# Patient Record
Sex: Male | Born: 1995 | Race: White | Hispanic: No | Marital: Single | Smoking: Never smoker
Health system: Southern US, Community
[De-identification: ages and names within clinical notes are randomized; demographics above are authoritative.]

## PROBLEM LIST (undated history)

## (undated) DIAGNOSIS — R519 Headache, unspecified: Secondary | ICD-10-CM

## (undated) DIAGNOSIS — K219 Gastro-esophageal reflux disease without esophagitis: Secondary | ICD-10-CM

## (undated) DIAGNOSIS — R51 Headache: Secondary | ICD-10-CM

## (undated) DIAGNOSIS — G8929 Other chronic pain: Secondary | ICD-10-CM

## (undated) DIAGNOSIS — G039 Meningitis, unspecified: Secondary | ICD-10-CM

## (undated) DIAGNOSIS — G919 Hydrocephalus, unspecified: Secondary | ICD-10-CM

## (undated) HISTORY — PX: BRAIN SURGERY: SHX531

## (undated) HISTORY — DX: Other chronic pain: G89.29

## (undated) HISTORY — DX: Gastro-esophageal reflux disease without esophagitis: K21.9

## (undated) HISTORY — DX: Hydrocephalus, unspecified: G91.9

## (undated) HISTORY — DX: Headache: R51

## (undated) HISTORY — PX: DENTAL SURGERY: SHX609

## (undated) HISTORY — DX: Headache, unspecified: R51.9

## (undated) HISTORY — PX: OTHER SURGICAL HISTORY: SHX169

## (undated) HISTORY — PX: LUMBAR PUNCTURE: SHX1985

## (undated) HISTORY — DX: Meningitis, unspecified: G03.9

## (undated) HISTORY — PX: UMBILICAL HERNIA REPAIR: SHX196

---
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
## 2015-04-29 IMAGING — US US SCROTUM
1 series · 14 of 25 positions shown · non-contrast
Comparison: None.

CLINICAL DATA: 17-year-old male with left scrotal fullness.

EXAM:
ULTRASOUND OF SCROTUM
TECHNIQUE: Complete ultrasound examination of the testicles, epididymis, and
other scrotal structures was performed.

[Series 1: us scrotum · 0.08mm/px · 14 of 40 slices shown]
[im 1/40]
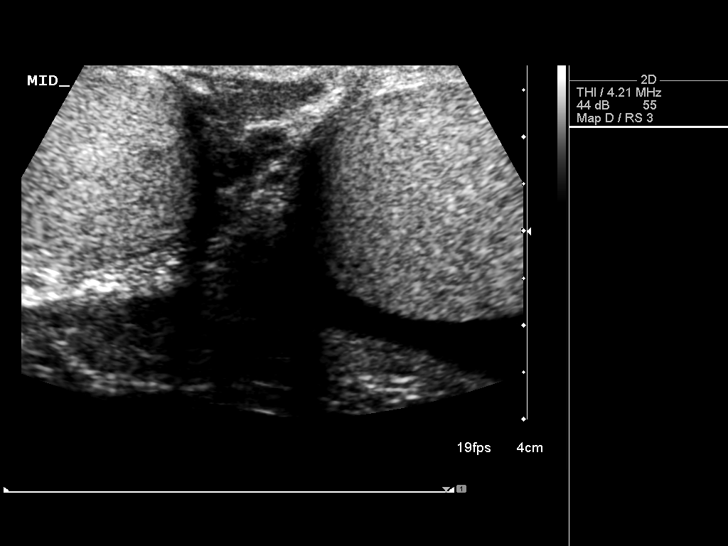
[im 4/40]
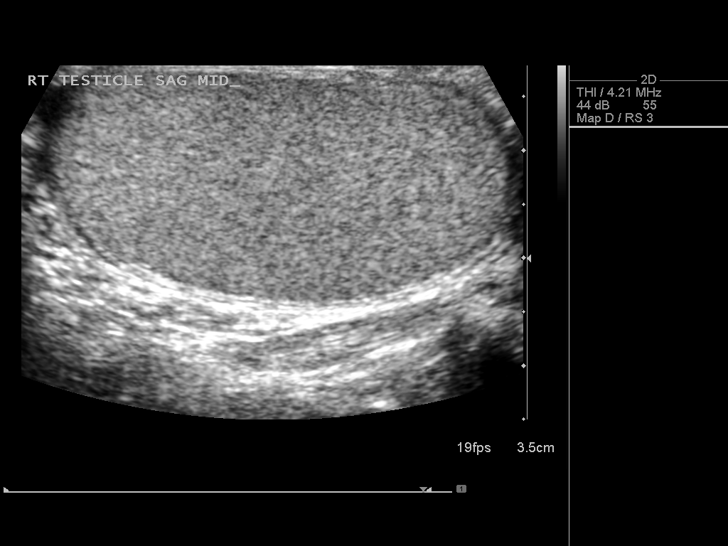
[im 7/40]
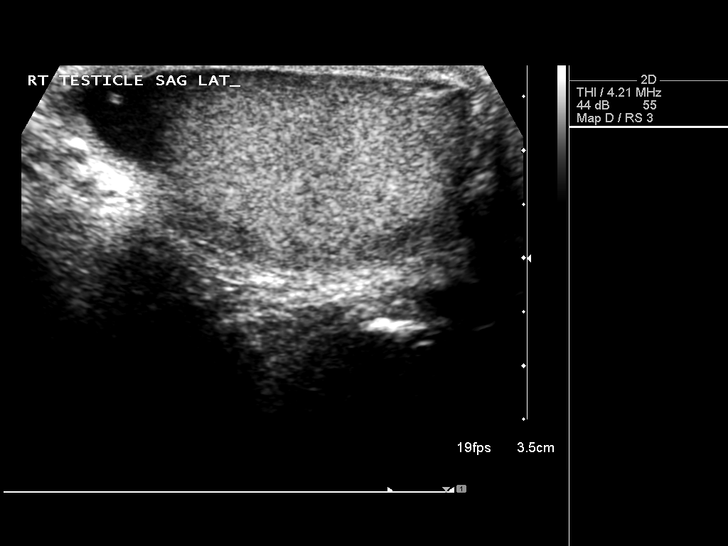
[im 10/40]
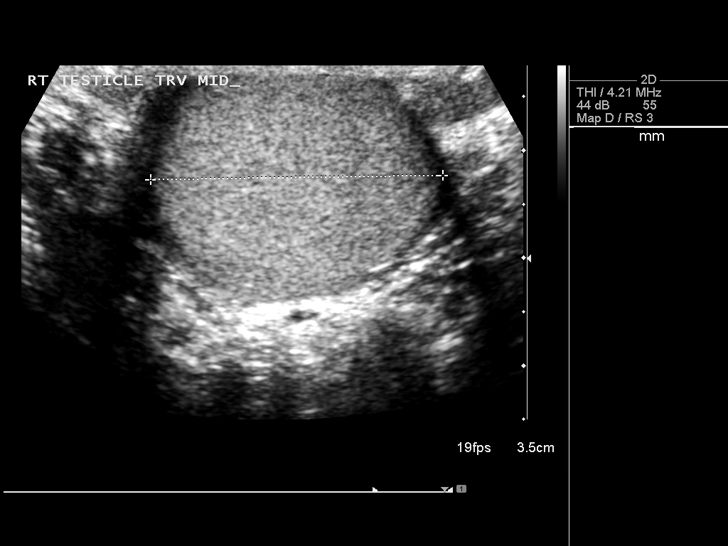
[im 14/40]
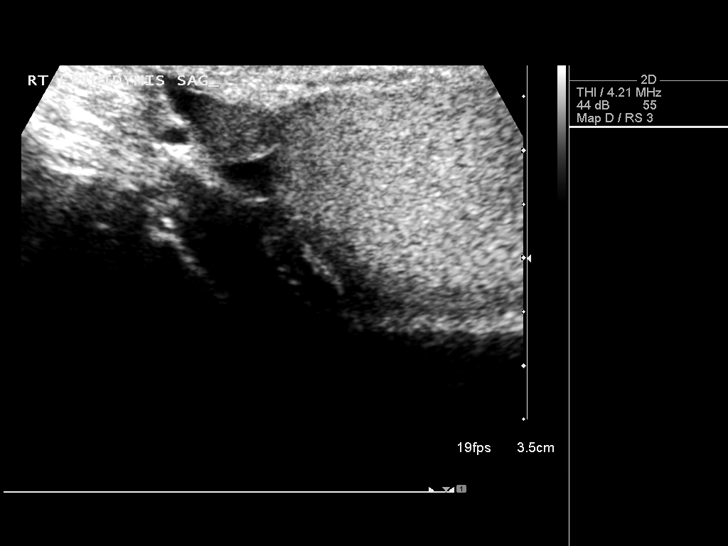
[im 15/40]
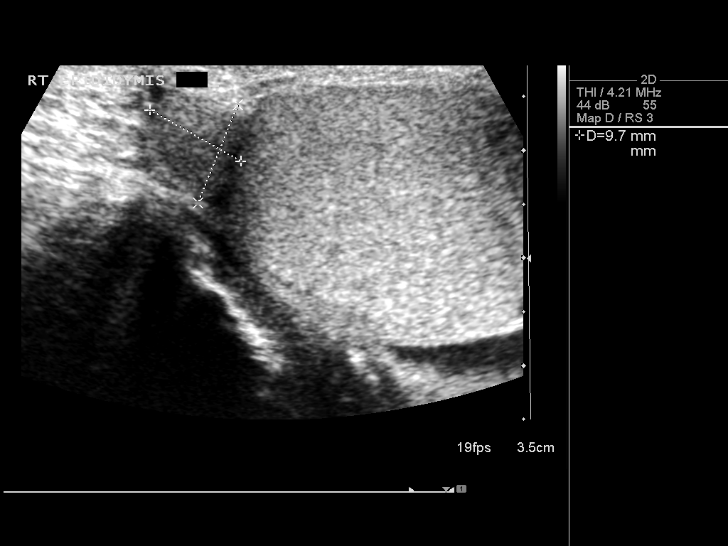
[im 18/40]
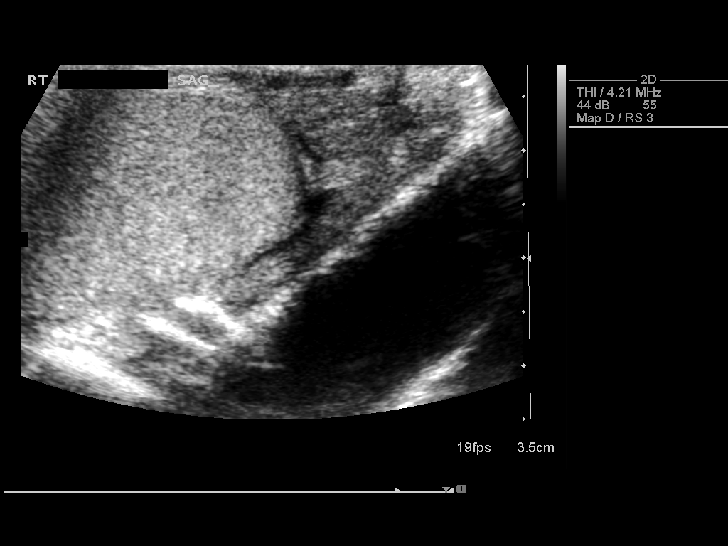
[im 22/40]
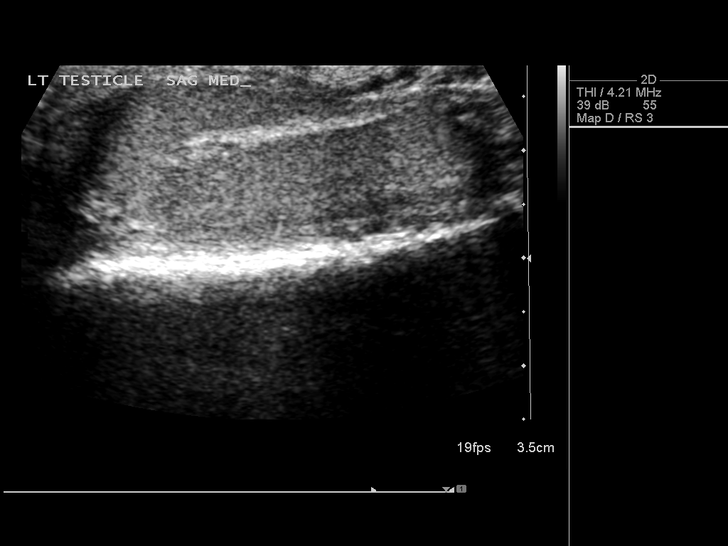
[im 25/40]
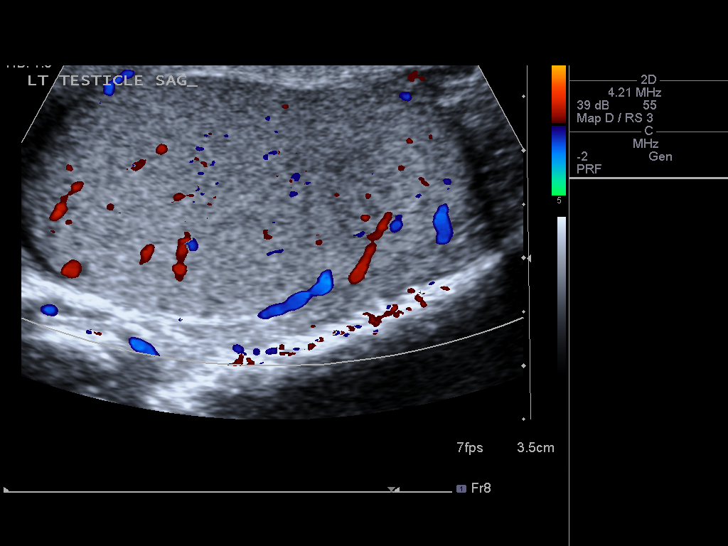
[im 27/40]
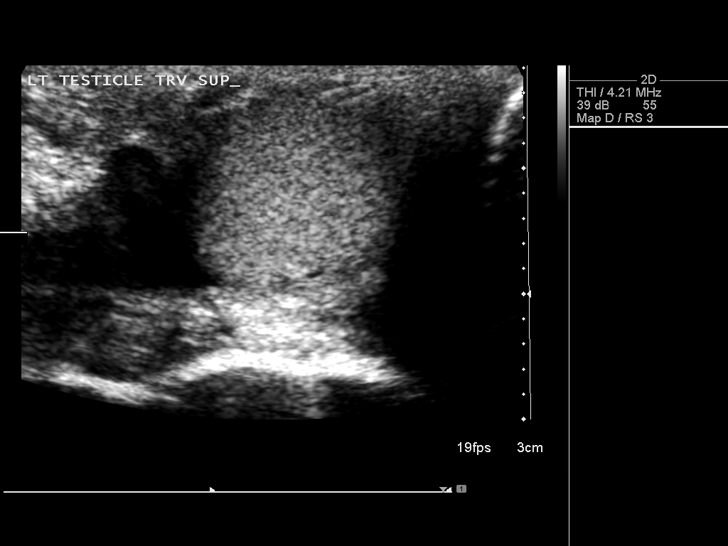
[im 30/40]
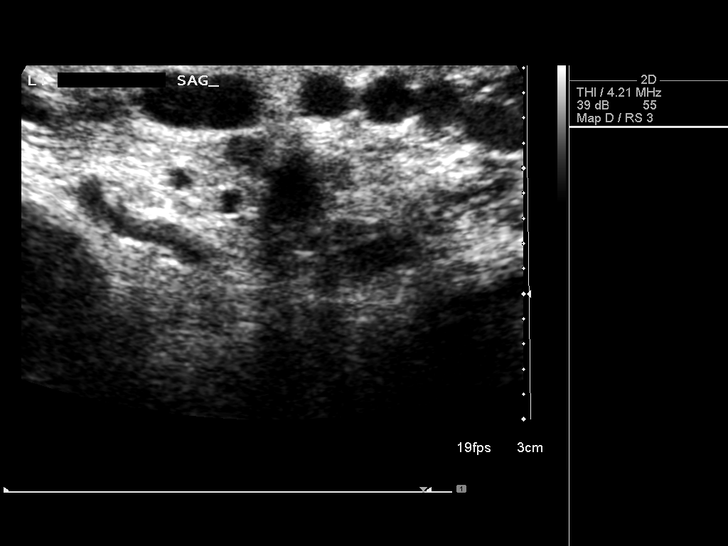
[im 33/40]
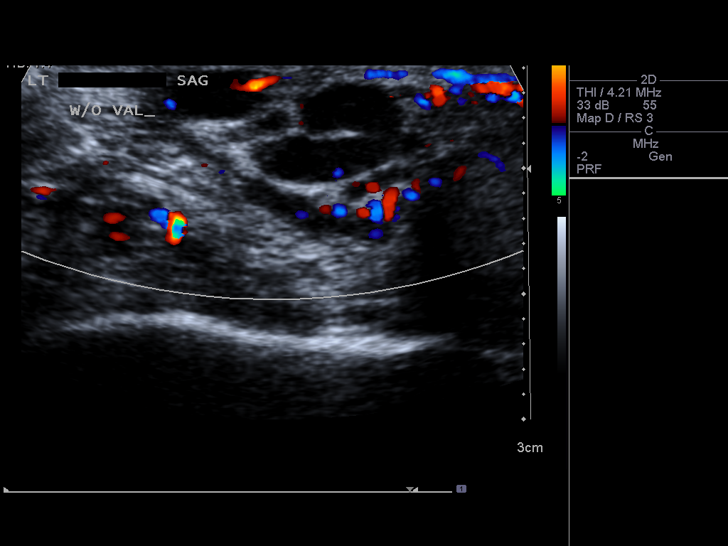
[im 36/40]
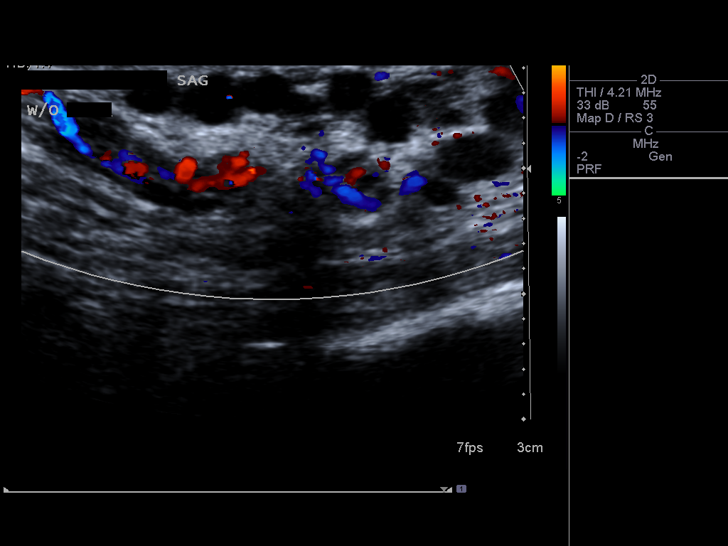
[im 40/40]
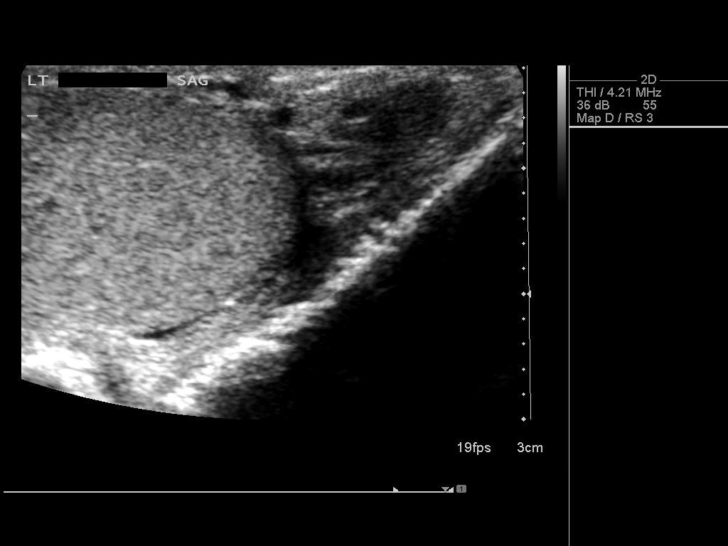

[14 of 25 positions shown; findings below may reference images not displayed]

FINDINGS: Right testicle

Measurements: 4.4 x 2.1 x 2.7 cm No mass or microlithiasis
visualized.

Left testicle

Measurements: 4.5 x 2.1 x 2.8 cm No mass or microlithiasis
visualized.

Right epididymis:  Normal in size and appearance.

Left epididymis:  Normal in size and appearance.

Hydrocele:  None visualized.

Varicocele:  A moderate left varicocele is noted.
IMPRESSION: Left varicocele.

Normal testicles bilaterally.  .

## 2017-12-28 ENCOUNTER — Ambulatory Visit (INDEPENDENT_AMBULATORY_CARE_PROVIDER_SITE_OTHER): Payer: 59 | Admitting: Pulmonary Disease

## 2017-12-28 ENCOUNTER — Encounter: Payer: Self-pay | Admitting: Pulmonary Disease

## 2017-12-28 VITALS — BP 128/80 | HR 73 | Ht 72.0 in | Wt 189.2 lb

## 2017-12-28 DIAGNOSIS — R059 Cough, unspecified: Secondary | ICD-10-CM

## 2017-12-28 DIAGNOSIS — R05 Cough: Secondary | ICD-10-CM | POA: Diagnosis not present

## 2017-12-28 LAB — NITRIC OXIDE: Nitric Oxide: 15

## 2017-12-28 NOTE — Progress Notes (Signed)
Daniel Smith    [id number]    [birth year]  Primary Care Physician:Lowe, Efraim Kaufmann, MD  Referring Physician: Loyola Mast, MD [address] [place], [place] [address]  Chief complaint: Evaluation for cough  HPI: 22 year old with no significant past medical history He had an episode of cough after sinusitis, bronchitis in late November.  He was given Z-Pak with resolution of symptoms.  He had recurrence of cough with chest pain in January.  Chest x-ray and EKG at that time were normal.  He was given Prilosec which improved symptoms.  He has remained symptom-free until today when he developed some sinus congestion, postnasal drip, allergic rhinitis and nonproductive cough.  Denies any dyspnea, wheezing, fevers, chills.  Pets: 2 dogs, no cats, birds, farm animals Occupation: Works as a Water quality scientist.  Wants to start grad school for an [employer] Exposures: No known exposures, no mold, hot tub, Smoking history: Never smoker Travel History: Not significant  Outpatient Encounter Medications as of 12/28/2017  Medication Sig  . acetaminophen (TYLENOL) 325 MG tablet Take 650 mg by mouth every 6 (six) hours as needed.  . [DISCONTINUED] HYDROcodone-acetaminophen (NORCO) 5-325 MG per tablet Take 1 tablet by mouth every 4 (four) hours as needed for pain.   No facility-administered encounter medications on file as of 12/28/2017.     Allergies as of 12/28/2017  . (No Known Allergies)    No past medical history on file.  Past Surgical History:  Procedure Laterality Date  . ear tubes      Family History  Problem Relation Age of Onset  . COPD Maternal Grandmother   . Cancer Maternal Grandfather   . COPD Maternal Grandfather   . Hypertension Paternal Grandmother     Social History   Socioeconomic History  . Marital status: Single    Spouse name: Not on file  . Number of children: Not on file  . Years of education: Not on file  . Highest education level: Not on file  Occupational  History  . Not on file  Social Needs  . Financial resource strain: Not on file  . Food insecurity:    Worry: Not on file    Inability: Not on file  . Transportation needs:    Medical: Not on file    Non-medical: Not on file  Tobacco Use  . Smoking status: Never Smoker  . Smokeless tobacco: Never Used  Substance and Sexual Activity  . Alcohol use: No  . Drug use: No  . Sexual activity: Not on file  Lifestyle  . Physical activity:    Days per week: Not on file    Minutes per session: Not on file  . Stress: Not on file  Relationships  . Social connections:    Talks on phone: Not on file    Gets together: Not on file    Attends religious service: Not on file    Active member of club or organization: Not on file    Attends meetings of clubs or organizations: Not on file    Relationship status: Not on file  . Intimate partner violence:    Fear of current or ex partner: Not on file    Emotionally abused: Not on file    Physically abused: Not on file    Forced sexual activity: Not on file  Other Topics Concern  . Not on file  Social History Narrative  . Not on file    Review of systems: Review  of Systems  Constitutional: Negative for fever and chills.  HENT: Negative.   Eyes: Negative for blurred vision.  Respiratory: as per HPI  Cardiovascular: Negative for chest pain and palpitations.  Gastrointestinal: Negative for vomiting, diarrhea, blood per rectum. Genitourinary: Negative for dysuria, urgency, frequency and hematuria.  Musculoskeletal: Negative for myalgias, back pain and joint pain.  Skin: Negative for itching and rash.  Neurological: Negative for dizziness, tremors, focal weakness, seizures and loss of consciousness.  Endo/Heme/Allergies: Negative for environmental allergies.  Psychiatric/Behavioral: Negative for depression, suicidal ideas and hallucinations.  All other systems reviewed and are negative.  Physical Exam: Blood pressure 128/80, pulse 73,  height 6' (1.829 m), weight 189 lb 3.2 oz (85.8 kg), SpO2 98 %. Gen:      No acute distress HEENT:  EOMI, sclera anicteric Neck:     No masses; no thyromegaly Lungs:    Clear to auscultation bilaterally; normal respiratory effort CV:         Regular rate and rhythm; no murmurs Abd:      + bowel sounds; soft, non-tender; no palpable masses, no distension Ext:    No edema; adequate peripheral perfusion Skin:      Warm and dry; no rash Neuro: alert and oriented x 3 Psych: normal mood and affect  Data Reviewed: FENO 12/28/17-15  Assessment:  Assessment for cough Suspicion for asthma is low. He likely has upper airway cough from postnasal drip, allergies Chest x-ray at primary care office was normal as per patient.  We will try to obtain these results for our records. I have advised him to use over-the-counter antihistamine and steroid nasal spray  We discussed getting allergy profile and PFTs but have decided to defer this testing Reassess if his symptoms are recurrent  Plan/Recommendations: - Over-the-counter antihistamine, Flonase nasal spray - Obtain results of chest x-ray from primary care  Return to clinic as needed  Chilton GreathousePraveen Lc Joynt MD [facility] 12/28/2017, 11:02 AM  CC: Loyola MastLowe, Melissa, MD

## 2017-12-28 NOTE — Patient Instructions (Signed)
It does not appear you have asthma or any other lung issues I suspect that your cough is from allergies, postnasal drip You can try over-the-counter Zyrtec or Claritin and Flonase nasal spray Please give us a call if your symptoms are persistent and we can check some blood test and lung function test to evaluate you for asthma Otherwise you can return to the pulmonary clinic as needed.

## 2018-05-06 ENCOUNTER — Telehealth: Payer: Self-pay | Admitting: Medical

## 2018-05-06 NOTE — Telephone Encounter (Signed)
Received medical records from Dr. Loyola MastMelissa Lowe. Sending back for review.

## 2018-05-28 ENCOUNTER — Encounter: Payer: Self-pay | Admitting: Medical

## 2018-05-28 ENCOUNTER — Ambulatory Visit: Payer: 59 | Admitting: Medical

## 2018-05-28 VITALS — BP 112/74 | HR 67 | Temp 97.7°F | Resp 16 | Ht 70.5 in | Wt 183.2 lb

## 2018-05-28 DIAGNOSIS — Z0000 Encounter for general adult medical examination without abnormal findings: Secondary | ICD-10-CM

## 2018-05-28 DIAGNOSIS — Z23 Encounter for immunization: Secondary | ICD-10-CM | POA: Diagnosis not present

## 2018-05-28 LAB — POCT URINALYSIS DIP (PROADVANTAGE DEVICE)
Bilirubin, UA: NEGATIVE
Blood, UA: NEGATIVE
Glucose, UA: NEGATIVE mg/dL
Ketones, POC UA: NEGATIVE mg/dL
Leukocytes, UA: NEGATIVE
Nitrite, UA: NEGATIVE
Protein Ur, POC: NEGATIVE mg/dL
Specific Gravity, Urine: 1.01
Urobilinogen, Ur: NEGATIVE
pH, UA: 7 (ref 5.0–8.0)

## 2018-05-28 NOTE — Progress Notes (Signed)
Subjective: Chief Complaint  Patient presents with  . NP    NP cpe non fasting vision 2019   Here as a new patient.   Was seeing [facility] prior.    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  Has some interest in real estate as well.  Medical care team includes: Abbie Jablon, Kermit Balo, PA-C here for primary care Dentist Eye doctor  Concerns: none  Reviewed their medical, surgical, family, social, medication, and allergy history and updated chart as appropriate.  Past Medical History:  Diagnosis Date  . Chronic headaches    on average every 2 months  . GERD (gastroesophageal reflux disease)   . Hydrocephalus 2007   initial procedure failed, 2nd procedure helped clear this as of 2017.  Dr. Marice Potter, [facility]  . Meningitis    as an infant    Past Surgical History:  Procedure Laterality Date  . BRAIN SURGERY     twice for hydrocephalus; first attempt failed to resolve   . ear tubes    . LUMBAR PUNCTURE     infant  . UMBILICAL HERNIA REPAIR      Social History   Socioeconomic History  . Marital status: Single    Spouse name: Not on file  . Number of children: Not on file  . Years of education: Not on file  . Highest education level: Not on file  Occupational History  . Not on file  Social Needs  . Financial resource strain: Not on file  . Food insecurity:    Worry: Not on file    Inability: Not on file  . Transportation needs:    Medical: Not on file    Non-medical: Not on file  Tobacco Use  . Smoking status: Never Smoker  . Smokeless tobacco: Never Used  Substance and Sexual Activity  . Alcohol use: No  . Drug use: No  . Sexual activity: Not on file  Lifestyle  . Physical activity:    Days per week: Not on file    Minutes per session: Not on file  . Stress: Not on file  Relationships  . Social connections:    Talks on phone: Not on file    Gets together: Not on  file    Attends religious service: Not on file    Active member of club or organization: Not on file    Attends meetings of clubs or organizations: Not on file    Relationship status: Not on file  . Intimate partner violence:    Fear of current or ex partner: Not on file    Emotionally abused: Not on file    Physically abused: Not on file    Forced sexual activity: Not on file  Other Topics Concern  . 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  Exercise - weights, some cardio.   Soccer coach.   Eats healthy.   No significant other.   05/2018    Family History  Problem Relation Age of Onset  . COPD Maternal Grandmother   . Cancer Maternal Grandfather        non hodgkins   . COPD Maternal Grandfather   . Hypertension Paternal Grandmother   . Irritable bowel syndrome Brother   . Cancer Paternal Grandfather        pancreatic    No current outpatient medications on file.  No Known Allergies  Review of Systems Constitutional: -fever, -chills, -sweats, -unexpected weight change, -decreased appetite, -fatigue Allergy: -sneezing, -itching, -congestion Dermatology: -changing moles, --rash, -lumps ENT: -runny nose, -ear pain, -sore throat, -hoarseness, -sinus pain, -teeth pain, - ringing in ears, -hearing loss, -nosebleeds Cardiology: -chest pain, -palpitations, -swelling, -difficulty breathing when lying flat, -waking up short of breath Respiratory: -cough, -shortness of breath, -difficulty breathing with exercise or exertion, -wheezing, -coughing up blood Gastroenterology: -abdominal pain, -nausea, -vomiting, -diarrhea, -constipation, -blood in stool, -changes in bowel movement, -difficulty swallowing or eating Hematology: -bleeding, -bruising  Musculoskeletal: -joint aches, -muscle aches, -joint swelling, -back pain, -neck pain, -cramping, -changes in gait Ophthalmology: denies vision changes, eye redness, itching, discharge Urology: -burning with  urination, -difficulty urinating, -blood in urine, -urinary frequency, -urgency, -incontinence Neurology: -headache, -weakness, -tingling, -numbness, -memory loss, -falls, -dizziness Psychology: -depressed mood, -agitation, -sleep problems Male GU: no testicular mass, pain, no lymph nodes swollen, no swelling, no rash.     Objective:  BP 112/74   Pulse 67   Temp 97.7 F (36.5 C) (Oral)   Resp 16   Ht 5' 10.5" (1.791 m)   Wt 183 lb 3.2 oz (83.1 kg)   SpO2 99%   BMI 25.91 kg/m   General appearance: alert, no distress, WD/WN, Caucasian male Skin: unremarkable, scattered macules, no worrisome lesions HEENT: normocephalic, conjunctiva/corneas normal, sclerae anicteric, PERRLA, EOMi, nares patent, no discharge or erythema, pharynx normal Oral cavity: MMM, tongue normal, teeth normal Neck: supple, no lymphadenopathy, no thyromegaly, no masses, normal ROM, no bruits Chest: non tender, normal shape and expansion Heart: RRR, normal S1, S2, no murmurs Lungs: CTA bilaterally, no wheezes, rhonchi, or rales Abdomen: +bs, soft, non tender, non distended, no masses, no hepatomegaly, no splenomegaly, no bruits Back: non tender, normal ROM, no scoliosis Musculoskeletal: upper extremities non tender, no obvious deformity, normal ROM throughout, lower extremities non tender, no obvious deformity, normal ROM throughout Extremities: no edema, no cyanosis, no clubbing Pulses: 2+ symmetric, upper and lower extremities, normal cap refill Neurological: alert, oriented x 3, CN2-12 intact, strength normal upper extremities and lower extremities, sensation normal throughout, DTRs 2+ throughout, no cerebellar signs, gait normal Psychiatric: normal affect, behavior normal, pleasant  GU: normal male external genitalia,circumcised, nontender, no masses, no hernia, no lymphadenopathy Rectal: deferred   Assessment and Plan :   Encounter Diagnoses  Name Primary?  . Routine general medical examination at a  health care facility Yes  . Need for influenza vaccination     Physical exam - discussed and counseled on healthy lifestyle, diet, exercise, preventative care, vaccinations, sick and well care, proper use of emergency dept and after hours care, and addressed their concerns.    Health screening: See your eye doctor yearly for routine vision care. See your dentist yearly for routine dental care including hygiene visits twice yearly.  Discussed STD testing, discussed prevention, condom use, means of transmission.  He reports STD screen earlier this year.  No new sexual partners since.    Cancer screening Advised monthly self testicular exam  Vaccinations: Advised yearly influenza vaccine Counseled on the influenza virus vaccine.  Vaccine information sheet given.  Influenza vaccine given after consent obtained.  Reviewed [place] vaccine registry.  He is up to date on vaccines including HPV series.  He declines routine screening labs  Tahjir was seen today for np.  Diagnoses and all orders for this visit:  Routine general medical examination at a health care facility -     POCT Urinalysis DIP (Proadvantage Device)  Need  for influenza vaccination -     Flu Vaccine QUAD 6+ mos PF IM (Fluarix Quad PF)   Follow-up pending labs, yearly for physical

## 2018-11-18 ENCOUNTER — Encounter: Payer: Self-pay | Admitting: Medical

## 2018-11-18 ENCOUNTER — Ambulatory Visit: Payer: 59 | Admitting: Medical

## 2018-11-18 VITALS — BP 120/76 | HR 72 | Temp 98.0°F | Resp 16 | Ht 72.0 in | Wt 174.6 lb

## 2018-11-18 DIAGNOSIS — F419 Anxiety disorder, unspecified: Secondary | ICD-10-CM | POA: Diagnosis not present

## 2018-11-18 DIAGNOSIS — L853 Xerosis cutis: Secondary | ICD-10-CM

## 2018-11-18 DIAGNOSIS — F428 Other obsessive-compulsive disorder: Secondary | ICD-10-CM

## 2018-11-18 DIAGNOSIS — B001 Herpesviral vesicular dermatitis: Secondary | ICD-10-CM | POA: Insufficient documentation

## 2018-11-18 MED ORDER — HYDROCORTISONE VALERATE 0.2 % EX OINT: 1.0000 "application " | TOPICAL_OINTMENT | Freq: Two times a day (BID) | CUTANEOUS | 0 refills | Status: AC

## 2018-11-18 MED ORDER — VALACYCLOVIR HCL 1 G PO TABS: ORAL_TABLET | ORAL | 0 refills | Status: DC
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
## 2018-11-18 NOTE — Patient Instructions (Addendum)
Cold Sore  A cold sore, also called a fever blister, is a small, fluid-filled sore that forms inside of the mouth or on the lips, gums, nose, chin, or cheeks. Cold sores can spread to other parts of the body, such as the eyes or fingers. Cold sores can spread from person to person (are contagious) until the sores crust over completely. Most cold sores go away within 2 weeks. What are the causes? Cold sores are caused by a virus (herpes simplex virus type 1, HSV-1). The virus can spread from person to person through close contact, such as through:  Kissing.  Touching the affected area.  Sharing personal items such as lip balm, razors, a drinking glass, or eating utensils. What increases the risk? You are more likely to develop this condition if you:  Are tired, stressed, or sick.  Are having your period (menstruating).  Are pregnant.  Take certain medicines.  Are out in cold weather or get too much sun. What are the signs or symptoms? Symptoms of a cold sore outbreak go through different stages. These are the stages of a cold sore:  Tingling, itching, or burning is felt 1-2 days before the outbreak.  Fluid-filled blisters appear on the lips, inside the mouth, on the nose, or on the cheeks.  The blisters start to ooze clear fluid.  The blisters dry up, and a yellow crust appears in their place.  The crust falls off. In some cases, other symptoms can develop during a cold sore outbreak. These can include:  Fever.  Sore throat.  Headache.  Muscle aches.  Swollen neck glands. How is this treated? There is no cure for cold sores or the virus that causes them. There is also no vaccine to prevent the virus. Most cold sores go away on their own without treatment within 2 weeks. Your doctor may prescribe medicines to:  Help with pain.  Keep the virus from growing.  Help you heal faster. Medicines may be in the form of creams, gels, pills, or a shot. Follow these  instructions at home: Medicines  Take or apply over-the-counter and prescription medicines only as told by your doctor.  Use a cotton-tip swab to apply creams or gels to your sores.  Ask your doctor if you can take lysine supplements. These may help with healing. Sore care   Do not touch the sores or pick the scabs.  Wash your hands often. Do not touch your eyes without washing your hands first.  Keep the sores clean and dry.  If told, put ice on the sores: ? Put ice in a plastic bag. ? Place a towel between your skin and the bag. ? Leave the ice on for 20 minutes, 2-3 times a day. Eating and drinking  Eat a soft, bland diet. Avoid eating hot, cold, or salty foods. These can hurt your mouth.  Use a straw if it hurts to drink out of a glass.  Eat foods that have a lot of lysine in them. These include meat, fish, and dairy products.  Avoid sugary foods, chocolates, nuts, and grains. These foods have a high amount of a substance (arginine) that can cause the virus to grow. Lifestyle  Do not kiss, have oral sex, or share personal items until your sores heal.  Stress, poor sleep, and being out in the sun can trigger outbreaks. Make sure you: ? Do activities that help you relax, such as deep breathing exercises or meditation. ? Get enough sleep. ? Apply sunscreen   on your lips before you go out in the sun. Contact a doctor if:  You have symptoms for more than 2 weeks.  You have pus coming from the sores.  You have redness that is spreading.  You have pain or irritation in your eye.  You get sores on your genitals.  Your sores do not heal within 2 weeks.  You get cold sores often. Get help right away if:  You have a fever and your symptoms suddenly get worse.  You have a headache and confusion.  You have tiredness (fatigue).  You do not want to eat as much as normal (loss of appetite).  You have a stiff neck or are sensitive to light. Summary  A cold sore is  a small, fluid-filled sore that forms inside of the mouth or on the lips, gums, nose, chin, or cheeks.  Cold sores can spread from person to person (are contagious) until the sores crust over completely. Most cold sores go away within 2 weeks.  Wash your hands often. Do not touch your eyes without washing your hands first.  Do not kiss, have oral sex, or share personal items until your sores heal.  Contact a doctor if your sores do not heal within 2 weeks. This information is not intended to replace advice given to you by your health care provider. Make sure you discuss any questions you have with your health care provider. Document Released: 03/05/2012 Document Revised: 02/04/2018 Document Reviewed: 02/04/2018 Elsevier Interactive Patient Education  2019 [employer] Inc.     Obsessive-Compulsive Disorder Obsessive-compulsive disorder (OCD) is a brain-based anxiety disorder. People with OCD have obsessions, compulsions, or both. Obsessions are unwanted and distressing thoughts, ideas, or urges that keep entering your mind and result in anxiety. You may find yourself trying to ignore them. You may try to stop or undo them with a compulsion. Compulsions are repetitive physical or mental acts that you feel you have to do. They may reduce or prevent any emotional distress, but in most instances, they are ineffective. Compulsions can be very time-consuming, often taking more than one hour each day. They can interfere with personal relationships and normal activities at home, school, or work. OCD can begin in childhood, but it usually starts in young adulthood and continues throughout life. Many people with OCD also have depression or another mental health disorder. What are the causes? The cause of this condition is not known. What increases the risk? This condition is more like to develop in:  People who have experienced trauma.  People who have a family history of OCD.  Women during and after  pregnancy.  People who have infections and post-infectious autoimmune syndrome.  People who have other mental health conditions.  People who abuse substances. What are the signs or symptoms? Symptoms of OCD include compulsions and obsessions. People with obsessions usually have a fear that something terrible will happen or that they will do something terrible. Examples of common obsessions include:  Fear of contamination with germs, waste, or poisonous substances.  Fear of making the wrong decision.  Violent or sexual thoughts or urges towards others.  Need for symmetry or exactness. Examples of common compulsions include:  Excessive handwashing or bathing due to fear of contamination.  Checking things over and over to make sure you finished a task, such as making sure you locked a door or unplugged a toaster.  Repeating an act or phrase over and over, sometimes a specific number of times, until it feels  right.  Arranging and rearranging objects to keep them in a certain order.  Having a very hard time making a decision and sticking to it. How is this diagnosed?  OCD is diagnosed through an assessment by your health care provider. Your health care provider will ask questions about any obsessions or compulsions you have and how they affect your life. Your health care provider may also ask about your medical history, prescription medicines, and drug use. Certain medical conditions and substances can cause symptoms that are similar to OCD. Your health care provider may also refer you to a mental health specialist. How is this treated? Treatment may include:  Cognitive therapy. This is a form of talk therapy. The goal is to identify and change the irrational thoughts associated with obsessions.  Behavioral therapy. A type of behavioral therapy called exposure and response prevention is often used. In this therapy, you will be exposed to the distressing situation that triggers your  compulsion and be prevented from responding to it. With repetition of this process over time, you will no longer feel the distress or need to perform the compulsion.  Self-soothing. Meditation, deep breathing, or yoga can help you manage the physiological symptoms of anxiety and can help with how you think.  Medicine. Certain types of antidepressant medicine may help reduce or control OCD symptoms. Medicine is most effective when used with cognitive or behavioral therapy. Treatment usually involves a combination of therapy and medicines. For severe OCD that does not respond to talk therapy and medicine, brain surgery or electrical stimulation of specific areas of the brain (deep brain stimulation) may be considered. Follow these instructions at home:  Take over-the-counter and prescription medicines only as told by your health care provider. Do not start taking any new medicines with approval from your health care provider.  Consider joining a support group for people with OCD.  Keep all follow-up visits as told by your health care provider. This is important. Contact a health care provider if:  You are not able to take your medicines as prescribed.  Your symptoms get worse. Get help right away if:  You have suicidal thoughts or thoughts about hurting yourself or others. If you ever feel like you may hurt yourself or others, or have thoughts about taking your own life, get help right away. You can go to your nearest emergency department or call:  Your local emergency services (911 in the [place]  A suicide crisis helpline, such as the National Suicide Prevention Lifeline at (716)119-8042. This is open 24-hours a day. Summary  Obsessive-compulsive disorder (OCD) is a brain-based anxiety disorder. People with OCD have obsessions, compulsions, or both.  OCD can interfere with personal relationships and normal activities at home, school, or work.  Treatment usually involves a combination  of therapy and medicines. This information is not intended to replace advice given to you by your health care provider. Make sure you discuss any questions you have with your health care provider. Document Released: 08/29/2001 Document Revised: 06/19/2016 Document Reviewed: 06/19/2016 Elsevier Interactive Patient Education  2019 [employer].        Counseling Services (NON- psychiatrist offices)  [facility] [address] [place], [place] [address] [phone]   [facility] [phone] [address], [place], [place] [address]   [facility] [phone]  [email or web address] [address], [address] [place], [place], [place] [address]   Lenise Arena. Charlyne Mom, therapist [phone] [address] [place],  [place] [address]   [facility] [phone] [address] [place], [place] [address]   Glade Lloyd, therapist [phone] [address] [place], [place] [address]   [facility] [phone] [address] [place], [place], [place] [address]

## 2019-03-14 ENCOUNTER — Ambulatory Visit (INDEPENDENT_AMBULATORY_CARE_PROVIDER_SITE_OTHER): Payer: 59 | Admitting: Psychology

## 2019-03-14 DIAGNOSIS — F419 Anxiety disorder, unspecified: Secondary | ICD-10-CM

## 2019-03-14 DIAGNOSIS — F4322 Adjustment disorder with anxiety: Secondary | ICD-10-CM

## 2019-03-24 ENCOUNTER — Ambulatory Visit (INDEPENDENT_AMBULATORY_CARE_PROVIDER_SITE_OTHER): Payer: 59 | Admitting: Psychology

## 2019-03-24 DIAGNOSIS — F4322 Adjustment disorder with anxiety: Secondary | ICD-10-CM | POA: Diagnosis not present

## 2019-03-24 DIAGNOSIS — F419 Anxiety disorder, unspecified: Secondary | ICD-10-CM | POA: Diagnosis not present

## 2019-04-02 ENCOUNTER — Ambulatory Visit: Payer: 59 | Admitting: Psychology

## 2019-04-08 ENCOUNTER — Ambulatory Visit (INDEPENDENT_AMBULATORY_CARE_PROVIDER_SITE_OTHER): Payer: 59 | Admitting: Psychology

## 2019-04-08 DIAGNOSIS — F419 Anxiety disorder, unspecified: Secondary | ICD-10-CM | POA: Diagnosis not present

## 2019-04-08 DIAGNOSIS — F4322 Adjustment disorder with anxiety: Secondary | ICD-10-CM | POA: Diagnosis not present

## 2019-04-15 ENCOUNTER — Ambulatory Visit: Payer: 59 | Admitting: Psychology

## 2019-04-22 ENCOUNTER — Ambulatory Visit (INDEPENDENT_AMBULATORY_CARE_PROVIDER_SITE_OTHER): Payer: 59 | Admitting: Psychology

## 2019-04-22 DIAGNOSIS — F419 Anxiety disorder, unspecified: Secondary | ICD-10-CM

## 2019-04-22 DIAGNOSIS — F4322 Adjustment disorder with anxiety: Secondary | ICD-10-CM

## 2019-05-02 ENCOUNTER — Ambulatory Visit (INDEPENDENT_AMBULATORY_CARE_PROVIDER_SITE_OTHER): Payer: 59 | Admitting: Psychology

## 2019-05-02 DIAGNOSIS — F4322 Adjustment disorder with anxiety: Secondary | ICD-10-CM | POA: Diagnosis not present

## 2019-05-02 DIAGNOSIS — F419 Anxiety disorder, unspecified: Secondary | ICD-10-CM | POA: Diagnosis not present

## 2019-05-14 ENCOUNTER — Ambulatory Visit (INDEPENDENT_AMBULATORY_CARE_PROVIDER_SITE_OTHER): Payer: 59 | Admitting: Psychology

## 2019-05-14 DIAGNOSIS — F4322 Adjustment disorder with anxiety: Secondary | ICD-10-CM | POA: Diagnosis not present

## 2019-05-23 ENCOUNTER — Ambulatory Visit (INDEPENDENT_AMBULATORY_CARE_PROVIDER_SITE_OTHER): Payer: 59 | Admitting: Psychology

## 2019-05-23 DIAGNOSIS — F4322 Adjustment disorder with anxiety: Secondary | ICD-10-CM

## 2019-05-23 DIAGNOSIS — F419 Anxiety disorder, unspecified: Secondary | ICD-10-CM

## 2019-05-30 ENCOUNTER — Ambulatory Visit (INDEPENDENT_AMBULATORY_CARE_PROVIDER_SITE_OTHER): Payer: 59 | Admitting: Psychology

## 2019-05-30 DIAGNOSIS — F419 Anxiety disorder, unspecified: Secondary | ICD-10-CM

## 2019-05-30 DIAGNOSIS — F4322 Adjustment disorder with anxiety: Secondary | ICD-10-CM | POA: Diagnosis not present

## 2019-06-12 ENCOUNTER — Ambulatory Visit (INDEPENDENT_AMBULATORY_CARE_PROVIDER_SITE_OTHER): Payer: 59 | Admitting: Psychology

## 2019-06-12 DIAGNOSIS — F4322 Adjustment disorder with anxiety: Secondary | ICD-10-CM

## 2019-06-12 DIAGNOSIS — F419 Anxiety disorder, unspecified: Secondary | ICD-10-CM | POA: Diagnosis not present

## 2019-06-24 ENCOUNTER — Other Ambulatory Visit: Payer: Self-pay

## 2019-06-24 DIAGNOSIS — Z20822 Contact with and (suspected) exposure to covid-19: Secondary | ICD-10-CM

## 2019-06-25 ENCOUNTER — Ambulatory Visit (INDEPENDENT_AMBULATORY_CARE_PROVIDER_SITE_OTHER): Payer: 59 | Admitting: Psychology

## 2019-06-25 DIAGNOSIS — F419 Anxiety disorder, unspecified: Secondary | ICD-10-CM | POA: Diagnosis not present

## 2019-06-25 DIAGNOSIS — F4322 Adjustment disorder with anxiety: Secondary | ICD-10-CM | POA: Diagnosis not present

## 2019-06-26 LAB — NOVEL CORONAVIRUS, NAA: SARS-CoV-2, NAA: DETECTED — AB

## 2019-06-30 ENCOUNTER — Encounter: Payer: Self-pay | Admitting: Medical

## 2019-06-30 ENCOUNTER — Other Ambulatory Visit: Payer: Self-pay

## 2019-06-30 ENCOUNTER — Ambulatory Visit: Payer: 59 | Admitting: Medical

## 2019-06-30 VITALS — Temp 97.0°F | Ht 72.0 in | Wt 180.0 lb

## 2019-06-30 DIAGNOSIS — U071 COVID-19: Secondary | ICD-10-CM | POA: Diagnosis not present

## 2019-06-30 DIAGNOSIS — R05 Cough: Secondary | ICD-10-CM

## 2019-06-30 DIAGNOSIS — R059 Cough, unspecified: Secondary | ICD-10-CM

## 2019-06-30 NOTE — Progress Notes (Signed)
Subjective:     Patient ID: Daniel Smith, male   DOB: 1996-05-23, 23 y.o.   MRN: [id number]  This visit type was conducted due to national recommendations for restrictions regarding the COVID-19 Pandemic (e.g. social distancing) in an effort to limit this patient's exposure and mitigate transmission in our community.  Due to their co-morbid illnesses, this patient is at least at moderate risk for complications without adequate follow up.  This format is felt to be most appropriate for this patient at this time.    Documentation for virtual audio and video telecommunications through Zoom encounter:  The patient was located at home. The provider was located in the office. The patient did consent to this visit and is aware of possible charges through their insurance for this visit.  The other persons participating in this telemedicine service were none. Time spent on call was 10 minutes and in review of previous records >10 minutes total.  This virtual service is not related to other E/M service within previous 7 days.   HPI Chief Complaint  Patient presents with  . Follow-up    COVID-congestion clearing-has cough    Virtual consult today for cough and congestion.  He notes that he had a Covid test Tuesday of last week about 6 days ago that was positive called in on Thursday.  He has had cough, congestion, loss of taste and smell, and his symptoms have basically been mild, cold-like symptoms.  The main symptom allergist ongoing cough that has improved.  He has been using Benadryl, Sudafed, DayQuil.  Overall is improving.  He has been self quarantining.  He is supposed to go back to work in 3 to 4 days this week once he meets his quarantine time and feels ongoing improvement.  He lives at home with parents and brother.  Mom and brother got tested and were negative, father gets tested today.  None of them have had symptoms at all.  He is trying to maintain quarantine away from them.  His parents  and brother do not have any major health issues.  Past Medical History:  Diagnosis Date  . Chronic headaches    on average every 2 months  . GERD (gastroesophageal reflux disease)   . Hydrocephalus ([place]) 2007   initial procedure failed, 2nd procedure helped clear this as of 2017.  Dr. Vira Agar, [facility]  . Meningitis    as an infant   Current Outpatient Medications on File Prior to Visit  Medication Sig Dispense Refill  . hydrocortisone valerate ointment (WESTCORT) 0.2 % Apply 1 application topically 2 (two) times daily. (Patient not taking: Reported on 06/30/2019) 45 g 0  . valACYclovir (VALTREX) 1000 MG tablet 2 tablets BID x 1 days for flare up (Patient not taking: Reported on 06/30/2019) 20 tablet 0   No current facility-administered medications on file prior to visit.     Review of Systems As in subjective    Objective:   Physical Exam  Temp (!) 97 F (36.1 C)   Ht 6' (1.829 m)   Wt 180 lb (81.6 kg)   BMI 24.41 kg/m   Due to coronavirus pandemic stay at home measures, patient visit was virtual and they were not examined in person.       Assessment:     Encounter Diagnoses  Name Primary?  . COVID-19 virus infection Yes  . Cough        Plan:     We discussed symptoms, Covid positive test, and  fortunately has had mild symptoms.  Advised he can use either Benadryl or DayQuil for cough.  Continue to hydrate well.  We discussed that his symptoms should resolve completely within the next 1 to 2 weeks given his current symptoms and timeframe of illness.  We discussed finishing out the self quarantine.  Including away from family in his own house.  Answered his questions.  He plans to go back to school Wednesday, 07/02/2019 assuming the cough is calming down.  He should be out of the quarantine/contagion period at that point.  [place] was seen today for follow-up.  Diagnoses and all orders for this visit:  COVID-19 virus infection  Cough

## 2019-07-02 ENCOUNTER — Encounter: Payer: Self-pay | Admitting: Medical

## 2019-07-02 ENCOUNTER — Ambulatory Visit: Payer: 59 | Admitting: Medical

## 2019-07-02 ENCOUNTER — Telehealth: Payer: Self-pay | Admitting: Medical

## 2019-07-02 ENCOUNTER — Other Ambulatory Visit: Payer: Self-pay

## 2019-07-02 VITALS — Temp 97.0°F | Ht 72.0 in | Wt 180.0 lb

## 2019-07-02 DIAGNOSIS — R21 Rash and other nonspecific skin eruption: Secondary | ICD-10-CM | POA: Insufficient documentation

## 2019-07-02 DIAGNOSIS — R238 Other skin changes: Secondary | ICD-10-CM

## 2019-07-02 DIAGNOSIS — U071 COVID-19: Secondary | ICD-10-CM

## 2019-07-02 NOTE — Telephone Encounter (Signed)
Patient informed. 

## 2019-07-02 NOTE — Telephone Encounter (Signed)
Please call him back.  I reviewed some of the latest data and there are at least 5 different types of rashes that have been reported related to Covid.  His appears to be possible vesicular rash.  I still recommend the same treatment recommendations we discussed.  Similarly if his eye specifically seems to be affected in the coming days then call back right away or go see eye doctor urgently.  His rash we discussed under his lab does not represent any of the other rashes that have been listed in the reports so far.  The other rashes that have been reported are either quite obvious abnormal appearing rashes on a foot or arm or torso, or they can be more subtle rashes on the toes or hands, or they could possibly have been generalized hives.     Thus if he develops any other new rash that significantly different from the small thing he has now, certainly call back and we can discuss.  The more obvious or serious the rash, I would be concerned about worsening general symptoms including lung symptoms or other symptoms that should prompt a call back to discuss  Thanks for letting me know about the rash and keep [place] informed

## 2019-07-02 NOTE — Progress Notes (Signed)
Subjective:     Patient ID: Daniel Smith, male   DOB: [birth year], 23 y.o.   MRN: [id number]  This visit type was conducted due to national recommendations for restrictions regarding the COVID-19 Pandemic (e.g. social distancing) in an effort to limit this patient's exposure and mitigate transmission in our community.  Due to their co-morbid illnesses, this patient is at least at moderate risk for complications without adequate follow up.  This format is felt to be most appropriate for this patient at this time.    Documentation for virtual audio and video telecommunications through Zoom encounter:  The patient was located at home. The provider was located in the office. The patient did consent to this visit and is aware of possible charges through their insurance for this visit.  The other persons participating in this telemedicine service were none. Time spent on call was 15 minutes and in review of previous records >15 minutes total.  This virtual service IS related to other E/M service within previous 7 days.   HPI Chief Complaint  Patient presents with  . Eye Problem    red and swollen with a blister forming on left eye    Virtual visit today for left symptoms.  We did a virtual last week after he was found to be positive for Covid.  Overall his cold symptoms are no worse, still relatively mild.  Over the last 2 days started having itching of his left lower eyelid and beneath the eyelid on the cheek.  Then yesterday started getting a small lesion that he describes as either a blister or pustule and some puffiness of the skin of the eye.  He denies eye drainage, no watery eyes, no pus, no redness of the eye itself.  No other rash noted.  Using nothing for the symptoms.  No visual changes. No other aggravating or relieving factors. No other complaint.  Review of Systems As in subjective    Objective:   Physical Exam Due to coronavirus pandemic stay at home measures, patient visit was  virtual and they were not examined in person.   Temp (!) 97 F (36.1 C)   Ht 6' (1.829 m)   Wt 180 lb (81.6 kg)   BMI 24.41 kg/m   Through video visit/Zoom there appears to be puffiness under the left orbit, there is a very small 2 mm diameter raised lesion that could be a pustule or vesicle but is hard to say with the clarity of the video Otherwise his face and eyes appear normal, no obvious conjunctival erythema, eye motions are normal       Assessment:     Encounter Diagnoses  Name Primary?  . Vesicular lesion Yes  . Rash   . COVID-19 virus infection        Plan:     We discussed his symptoms and concerns.  He seems to be doing relatively okay with Covid infection in general, no worsening of his mild cold symptoms for last week.  Advised to use over-the-counter triple antibiotic directly on the lesion under the orbit but apply this with a cotton swab.  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  He will begin this for acute flareup in the event this happens to be vesicular.  Advised if he has any other worsening rash, new rash or worsening symptoms particularly with eye involvement to get checked out right away.  Otherwise follow-up prn  Daniel Smith was seen today for eye problem.  Diagnoses and all  orders for this visit:  Vesicular lesion  Rash  COVID-19 virus infection

## 2019-07-03 ENCOUNTER — Other Ambulatory Visit: Payer: Self-pay | Admitting: Medical

## 2019-07-03 ENCOUNTER — Ambulatory Visit: Payer: 59 | Admitting: Medical

## 2019-07-03 ENCOUNTER — Telehealth: Payer: Self-pay | Admitting: Medical

## 2019-07-03 DIAGNOSIS — U071 COVID-19: Secondary | ICD-10-CM

## 2019-07-03 MED ORDER — ERYTHROMYCIN 5 MG/GM OP OINT: 1.0000 "application " | TOPICAL_OINTMENT | Freq: Every day | OPHTHALMIC | 0 refills | Status: AC

## 2019-07-03 NOTE — Telephone Encounter (Signed)
In the future put this in as a telephone note and not a staff message as staff messages do not get saved to the chart  See if he can get in with an eye doctor today or tomorrow virtually or if needed help him get an appointment with an eye doctor just to make sure there is no herpetic lesions in the eye itself.  I will send out an ointment to put in the eye in the event he has a conjunctivitis.  His current symptoms with the crusting in discharge and bumps suggest now he has developed a conjunctivitis which can certainly be seen with Covid

## 2019-07-03 NOTE — Telephone Encounter (Signed)
Patient was informed and he stated he has an eye doctor

## 2019-07-04 ENCOUNTER — Encounter (INDEPENDENT_AMBULATORY_CARE_PROVIDER_SITE_OTHER): Payer: Self-pay

## 2019-07-17 ENCOUNTER — Ambulatory Visit (INDEPENDENT_AMBULATORY_CARE_PROVIDER_SITE_OTHER): Payer: 59 | Admitting: Psychology

## 2019-07-17 DIAGNOSIS — F4322 Adjustment disorder with anxiety: Secondary | ICD-10-CM

## 2019-07-23 ENCOUNTER — Telehealth: Payer: Self-pay | Admitting: Medical

## 2019-07-23 NOTE — Telephone Encounter (Signed)
CDC website suggests there is not a clear answer on this.   Current evidence suggest that the likelihood is that he would be immune for 6 months or more but there is no guarantee.   It may be worth a tele visit to discuss if needs to answer some specific questions related to work or if he is around folks that are high risk.

## 2019-07-23 NOTE — Telephone Encounter (Signed)
Pt tested positive last month and was all better and was just notified was in close contact with someone who tested positive this morning.  He was around them on Saturday and wants to know if he is immune or if he needs to be retested or quarantine or what?  He did NOT have a mask on when he was around them

## 2019-07-24 NOTE — Telephone Encounter (Signed)
Called pt and informed, at this time he has no symptoms and is not interested in being retested or virtual visit.  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

## 2019-07-25 ENCOUNTER — Other Ambulatory Visit: Payer: Self-pay

## 2019-07-25 DIAGNOSIS — Z20822 Contact with and (suspected) exposure to covid-19: Secondary | ICD-10-CM

## 2019-07-26 LAB — NOVEL CORONAVIRUS, NAA: SARS-CoV-2, NAA: NOT DETECTED

## 2019-08-08 ENCOUNTER — Ambulatory Visit (INDEPENDENT_AMBULATORY_CARE_PROVIDER_SITE_OTHER): Payer: 59 | Admitting: Psychology

## 2019-08-08 DIAGNOSIS — F4322 Adjustment disorder with anxiety: Secondary | ICD-10-CM | POA: Diagnosis not present

## 2019-09-26 ENCOUNTER — Ambulatory Visit (INDEPENDENT_AMBULATORY_CARE_PROVIDER_SITE_OTHER): Payer: 59 | Admitting: Psychology

## 2019-09-26 DIAGNOSIS — F419 Anxiety disorder, unspecified: Secondary | ICD-10-CM

## 2019-09-26 DIAGNOSIS — F4322 Adjustment disorder with anxiety: Secondary | ICD-10-CM

## 2019-11-21 ENCOUNTER — Ambulatory Visit (INDEPENDENT_AMBULATORY_CARE_PROVIDER_SITE_OTHER): Payer: 59 | Admitting: Psychology

## 2019-11-21 DIAGNOSIS — F419 Anxiety disorder, unspecified: Secondary | ICD-10-CM

## 2019-11-21 DIAGNOSIS — F4322 Adjustment disorder with anxiety: Secondary | ICD-10-CM

## 2020-01-09 ENCOUNTER — Ambulatory Visit (INDEPENDENT_AMBULATORY_CARE_PROVIDER_SITE_OTHER): Payer: 59 | Admitting: Psychology

## 2020-01-09 DIAGNOSIS — F4322 Adjustment disorder with anxiety: Secondary | ICD-10-CM

## 2020-01-09 DIAGNOSIS — F419 Anxiety disorder, unspecified: Secondary | ICD-10-CM | POA: Diagnosis not present

## 2020-02-17 ENCOUNTER — Ambulatory Visit (INDEPENDENT_AMBULATORY_CARE_PROVIDER_SITE_OTHER): Payer: 59 | Admitting: Psychology

## 2020-02-17 DIAGNOSIS — F419 Anxiety disorder, unspecified: Secondary | ICD-10-CM | POA: Diagnosis not present

## 2020-02-17 DIAGNOSIS — F4322 Adjustment disorder with anxiety: Secondary | ICD-10-CM | POA: Diagnosis not present

## 2020-04-08 ENCOUNTER — Ambulatory Visit: Payer: 59 | Attending: Internal Medicine

## 2020-04-08 DIAGNOSIS — Z23 Encounter for immunization: Secondary | ICD-10-CM

## 2020-04-08 NOTE — Progress Notes (Signed)
° °  Covid-19 Vaccination Clinic  Name:  Daniel Smith    MRN: [id number] DOB: [birth year]  04/08/2020  [name] was observed post Covid-19 immunization for 15 minutes without incident. He was provided with Vaccine Information Sheet and instruction to access the V-Safe system.   [name] was instructed to call 911 with any severe reactions post vaccine:  Difficulty breathing   Swelling of face and throat   A fast heartbeat   A bad rash all over body   Dizziness and weakness   Immunizations Administered    Name Date Dose VIS Date Route   Pfizer COVID-19 Vaccine 04/08/2020  2:05 PM 0.3 mL 11/12/2018 Intramuscular   Manufacturer: [employer], [employer]   Lot: [id number]   NDC: [address]-8

## 2020-04-13 ENCOUNTER — Ambulatory Visit (INDEPENDENT_AMBULATORY_CARE_PROVIDER_SITE_OTHER): Payer: 59 | Admitting: Psychology

## 2020-04-13 DIAGNOSIS — F4322 Adjustment disorder with anxiety: Secondary | ICD-10-CM | POA: Diagnosis not present

## 2020-04-13 DIAGNOSIS — F419 Anxiety disorder, unspecified: Secondary | ICD-10-CM

## 2020-05-04 ENCOUNTER — Ambulatory Visit: Payer: 59 | Attending: Internal Medicine

## 2020-05-04 DIAGNOSIS — Z23 Encounter for immunization: Secondary | ICD-10-CM

## 2020-05-04 NOTE — Progress Notes (Signed)
   Covid-19 Vaccination Clinic  Name:  Daniel Smith    MRN: [id number] DOB: [birth year]  05/04/2020  Mr. Daniel Smith was observed post Covid-19 immunization for 15 minutes without incident. He was provided with Vaccine Information Sheet and instruction to access the V-Safe system.   Mr. Daniel Smith was instructed to call 911 with any severe reactions post vaccine: Marland Kitchen Difficulty breathing  . Swelling of face and throat  . A fast heartbeat  . A bad rash all over body  . Dizziness and weakness   Immunizations Administered    Name Date Dose VIS Date Route   Pfizer COVID-19 Vaccine 05/04/2020  4:13 PM 0.3 mL 11/12/2018 Intramuscular   Manufacturer: [employer], [employer]   Lot: [id number]   NDC: [address]-3

## 2020-05-17 ENCOUNTER — Other Ambulatory Visit: Payer: Self-pay

## 2020-05-17 ENCOUNTER — Encounter: Payer: Self-pay | Admitting: Medical

## 2020-05-17 ENCOUNTER — Ambulatory Visit: Payer: 59 | Admitting: Medical

## 2020-05-17 VITALS — BP 130/78 | HR 75 | Ht 72.0 in | Wt 207.8 lb

## 2020-05-17 DIAGNOSIS — Z0000 Encounter for general adult medical examination without abnormal findings: Secondary | ICD-10-CM | POA: Diagnosis not present

## 2020-05-17 DIAGNOSIS — Z13220 Encounter for screening for lipoid disorders: Secondary | ICD-10-CM | POA: Diagnosis not present

## 2020-05-17 DIAGNOSIS — Z7189 Other specified counseling: Secondary | ICD-10-CM

## 2020-05-17 DIAGNOSIS — Z7185 Encounter for immunization safety counseling: Secondary | ICD-10-CM

## 2020-05-17 NOTE — Progress Notes (Signed)
Subjective:   HPI  Daniel Smith is a 24 y.o. male who presents for Chief Complaint  Patient presents with  . Annual Exam    not fasting    Patient Care Team: Camyra Vaeth, Kermit Balo, PA-C as PCP - General (Family Medicine) Sees dentist Sees eye doctor  Concerns: He notes 2 new jobs, school teacher, Environmental education officer.  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  Has red hands, dry hands.  Just saw dermatology recently for biopsy on right arm and they have him on steroid cream for chronic red/flushed hands, dryness.   Reviewed their medical, surgical, family, social, medication, and allergy history and updated chart as appropriate.  Past Medical History:  Diagnosis Date  . Chronic headaches    on average every 2 months  . GERD (gastroesophageal reflux disease)   . Hydrocephalus (HCC) 2007   initial procedure failed, 2nd procedure helped clear this as of 2017.  Dr. Marice Potter, [facility]  . Meningitis    as an infant    Past Surgical History:  Procedure Laterality Date  . BRAIN SURGERY     twice for hydrocephalus; first attempt failed to resolve   . DENTAL SURGERY     implant  . ear tubes    . LUMBAR PUNCTURE     infant  . UMBILICAL HERNIA REPAIR      Family History  Problem Relation Age of Onset  . COPD Maternal Grandmother   . Cancer Maternal Grandfather        non hodgkins   . COPD Maternal Grandfather   . Hypertension Paternal Grandmother   . Irritable bowel syndrome Brother   . Cancer Paternal Grandfather        pancreatic  . Heart disease Neg Hx      Current Outpatient Medications:  .  erythromycin ophthalmic ointment, Place 1 application into the left eye at bedtime. (Patient not taking: Reported on 05/17/2020), Disp: 3.5 g, Rfl: 0 .  hydrocortisone valerate ointment (WESTCORT) 0.2 %, Apply 1 application topically 2 (two) times daily. (Patient not taking: Reported on 06/30/2019), Disp: 45 g, Rfl: 0 .   valACYclovir (VALTREX) 1000 MG tablet, 2 tablets BID x 1 days for flare up (Patient not taking: Reported on 06/30/2019), Disp: 20 tablet, Rfl: 0  No Known Allergies     Review of Systems Constitutional: -fever, -chills, -sweats, -unexpected weight change, -decreased appetite, -fatigue Allergy: -sneezing, -itching, -congestion Dermatology: -changing moles, --rash, -lumps ENT: -runny nose, -ear pain, -sore throat, -hoarseness, -sinus pain, -teeth pain, - ringing in ears, -hearing loss, -nosebleeds Cardiology: -chest pain, -palpitations, -swelling, -difficulty breathing when lying flat, -waking up short of breath Respiratory: -cough, -shortness of breath, -difficulty breathing with exercise or exertion, -wheezing, -coughing up blood Gastroenterology: -abdominal pain, -nausea, -vomiting, -diarrhea, -constipation, -blood in stool, -changes in bowel movement, -difficulty swallowing or eating Hematology: -bleeding, -bruising  Musculoskeletal: -joint aches, -muscle aches, -joint swelling, -back pain, -neck pain, -cramping, -changes in gait Ophthalmology: denies vision changes, eye redness, itching, discharge Urology: -burning with urination, -difficulty urinating, -blood in urine, -urinary frequency, -urgency, -incontinence Neurology: -headache, -weakness, -tingling, -numbness, -memory loss, -falls, -dizziness Psychology: -depressed mood, -agitation, -sleep problems Male GU: no testicular mass, pain, no lymph nodes swollen, no swelling, no rash.     Objective:  BP 130/78   Pulse 75   Ht 6' (1.829 m)   Wt 207 lb 12.8 oz (94.3 kg)   SpO2 98%   BMI 28.18 kg/m  General appearance: alert, no distress, WD/WN, Caucasian male Skin: scattered macules, somewhat pink/red flushed hands chronically, otherwise unremarkable Neck: supple, no lymphadenopathy, no thyromegaly, no masses, normal ROM, no bruits Chest: non tender, normal shape and expansion Heart: RRR, normal S1, S2, no murmurs Lungs: CTA  bilaterally, no wheezes, rhonchi, or rales Abdomen: +bs, soft, non tender, non distended, no masses, no hepatomegaly, no splenomegaly, no bruits Back: non tender, normal ROM, no scoliosis Musculoskeletal: upper extremities non tender, no obvious deformity, normal ROM throughout, lower extremities non tender, no obvious deformity, normal ROM throughout Extremities: no edema, no cyanosis, no clubbing Pulses: 2+ symmetric, upper and lower extremities, normal cap refill Neurological: alert, oriented x 3, CN2-12 intact, strength normal upper extremities and lower extremities, sensation normal throughout, DTRs 2+ throughout, no cerebellar signs, gait normal Psychiatric: normal affect, behavior normal, pleasant  GU: normal male external genitalia,circumcised, nontender, no masses, no hernia, no lymphadenopathy Rectal: deferred   Assessment and Plan :   Encounter Diagnoses  Name Primary?  . Encounter for health maintenance examination in adult Yes  . Screening for lipid disorders   . Vaccine counseling     Physical exam - discussed and counseled on healthy lifestyle, diet, exercise, preventative care, vaccinations, sick and well care, proper use of emergency dept and after hours care, and addressed their concerns.    Health screening: See your eye doctor yearly for routine vision care. See your dentist yearly for routine dental care including hygiene visits twice yearly.  Cancer screening Advised monthly self testicular exam  Vaccinations: Advised yearly influenza vaccine Declines flu shot He notes up to date on Td and covid vaccines   Separate significant issues discussed: He will return at separate date for fasting lipid since he is non fasting today  Eczema, flushing of hands - seeing dermatology  [name] was seen today for annual exam.  Diagnoses and all orders for this visit:  Encounter for health maintenance examination in adult -     Comprehensive metabolic panel -      CBC -     Lipid panel; Future  Screening for lipid disorders -     Lipid panel; Future  Vaccine counseling    Follow-up pending labs, yearly for physical

## 2020-05-18 LAB — COMPREHENSIVE METABOLIC PANEL
ALT: 20 IU/L (ref 0–44)
AST: 17 IU/L (ref 0–40)
Albumin/Globulin Ratio: 2.2 (ref 1.2–2.2)
Albumin: 4.9 g/dL (ref 4.1–5.2)
Alkaline Phosphatase: 75 IU/L (ref 48–121)
BUN/Creatinine Ratio: 12 (ref 9–20)
BUN: 13 mg/dL (ref 6–20)
Bilirubin Total: 0.6 mg/dL (ref 0.0–1.2)
CO2: 21 mmol/L (ref 20–29)
Calcium: 9.2 mg/dL (ref 8.7–10.2)
Chloride: 102 mmol/L (ref 96–106)
Creatinine, Ser: 1.11 mg/dL (ref 0.76–1.27)
GFR calc Af Amer: 107 mL/min/{1.73_m2} (ref >59)
GFR calc non Af Amer: 92 mL/min/{1.73_m2} (ref >59)
Globulin, Total: 2.2 g/dL (ref 1.5–4.5)
Glucose: 91 mg/dL (ref 65–99)
Potassium: 4.1 mmol/L (ref 3.5–5.2)
Sodium: 141 mmol/L (ref 134–144)
Total Protein: 7.1 g/dL (ref 6.0–8.5)

## 2020-05-18 LAB — CBC
Hematocrit: 44.4 % (ref 37.5–51.0)
Hemoglobin: 15.1 g/dL (ref 13.0–17.7)
MCH: 30.2 pg (ref 26.6–33.0)
MCHC: 34 g/dL (ref 31.5–35.7)
MCV: 89 fL (ref 79–97)
Platelets: 301 10*3/uL (ref 150–450)
RBC: 5 x10E6/uL (ref 4.14–5.80)
RDW: 12.3 % (ref 11.6–15.4)
WBC: 9 10*3/uL (ref 3.4–10.8)

## 2020-06-15 ENCOUNTER — Other Ambulatory Visit: Payer: Self-pay

## 2020-06-16 ENCOUNTER — Telehealth: Payer: Self-pay | Admitting: Medical

## 2020-06-16 MED ORDER — VALACYCLOVIR HCL 1 G PO TABS
ORAL_TABLET | ORAL | 0 refills | Status: DC
Start: 2020-06-16 — End: 2020-12-17

## 2020-06-16 NOTE — Telephone Encounter (Signed)
Requested immunization history received from [facility] and [employer]

## 2020-06-16 NOTE — Telephone Encounter (Signed)
[facility] is requesting to fill pt valtrex . Please advise [facility]

## 2020-06-21 ENCOUNTER — Telehealth: Payer: Self-pay | Admitting: Medical

## 2020-06-21 NOTE — Telephone Encounter (Signed)
I received the vaccine records on him.  He is up-to-date on tetanus which was done in 2014.  I recommend a yearly flu shot and the Covid vaccine.  The vaccine record does show one of the HPV vaccines.  So I am not sure if he ever had the other 2 doses.  I would recommend he have the other 2 doses at time 0 in 6 months.

## 2020-06-22 NOTE — Telephone Encounter (Signed)
This message has been sent to patient via mychart. Patient was asked to call the office and schedule to have HPV #2 and 3 if he has not had them already.

## 2020-12-17 ENCOUNTER — Other Ambulatory Visit: Payer: Self-pay | Admitting: Family Medicine

## 2020-12-17 NOTE — Telephone Encounter (Signed)
[facility] is requesting to fill pt valtrex. Last annual exam was 8/21 and next annual exam is 9/22. Please advise [facility]

## 2021-05-19 ENCOUNTER — Encounter: Payer: Self-pay | Admitting: Medical

## 2021-05-19 ENCOUNTER — Encounter: Payer: 59 | Admitting: Medical

## 2021-05-19 ENCOUNTER — Telehealth: Payer: Self-pay | Admitting: Medical

## 2021-05-19 DIAGNOSIS — Z0000 Encounter for general adult medical examination without abnormal findings: Secondary | ICD-10-CM

## 2021-05-19 NOTE — Telephone Encounter (Signed)

## 2021-11-08 ENCOUNTER — Other Ambulatory Visit: Payer: Self-pay | Admitting: Medical

## 2022-05-24 ENCOUNTER — Encounter: Payer: Self-pay | Admitting: Internal Medicine

## 2022-06-27 ENCOUNTER — Encounter: Payer: Self-pay | Admitting: Internal Medicine

## 2023-02-24 ENCOUNTER — Emergency Department (HOSPITAL_COMMUNITY)
Admission: EM | Admit: 2023-02-24 | Discharge: 2023-02-24 | Disposition: A | Discharge status: Home / Self Care | Payer: BC Managed Care – PPO | Attending: Emergency Medicine | Admitting: Emergency Medicine

## 2023-02-24 ENCOUNTER — Encounter (HOSPITAL_COMMUNITY): Payer: Self-pay

## 2023-02-24 ENCOUNTER — Other Ambulatory Visit: Payer: Self-pay

## 2023-02-24 DIAGNOSIS — S060XAA Concussion with loss of consciousness status unknown, initial encounter: Secondary | ICD-10-CM

## 2023-02-24 DIAGNOSIS — R030 Elevated blood-pressure reading, without diagnosis of hypertension: Secondary | ICD-10-CM | POA: Insufficient documentation

## 2023-02-24 DIAGNOSIS — S0990XA Unspecified injury of head, initial encounter: Secondary | ICD-10-CM | POA: Diagnosis present

## 2023-02-24 DIAGNOSIS — H539 Unspecified visual disturbance: Secondary | ICD-10-CM

## 2023-02-24 DIAGNOSIS — W06XXXA Fall from bed, initial encounter: Secondary | ICD-10-CM | POA: Diagnosis not present

## 2023-02-24 NOTE — ED Provider Notes (Signed)
[place] EMERGENCY DEPARTMENT AT [facility] Provider Note   CSN: [id number] Arrival date & time: 02/24/23  1135     History  Chief Complaint  Patient presents with   Head Injury   Headache    Daniel Smith is a 27 y.o. male.  27 year old male with a history of hydrocephalus as a child who presents emergency department after head trauma.  Patient states that on Tuesday evening he was sitting up in his bed in the middle of the night when he thinks that he struck his head on a door frame.  Does not believe he lost consciousness after this.  Says that he had mild headache and some confusion and foggy headedness since.  No nausea or vomiting afterwards.  No seizures.  Not on blood thinners.  Says that his hydrocephalus was due to an infection and is not being followed for this.  Some mild neck pain on the right side.  Says that yesterday also started developing flashes out of his right eye but denies any visual field cuts or eye pain.  No changes in his visual acuity.       Home Medications Prior to Admission medications   Medication Sig Start Date End Date Taking? Authorizing Provider  erythromycin ophthalmic ointment Place 1 application into the left eye at bedtime. Patient not taking: Reported on 05/17/2020 07/03/19   Tysinger, Kermit Balo, PA-C  hydrocortisone valerate ointment (WESTCORT) 0.2 % Apply 1 application topically 2 (two) times daily. Patient not taking: Reported on 06/30/2019 11/18/18   Tysinger, Kermit Balo, PA-C  valACYclovir (VALTREX) 1000 MG tablet TAKE 2 TABLETS BY MOUTH TWICE A DAY FOR 1 DAY FOR FLARE UP 11/08/21   Tysinger, Kermit Balo, PA-C      Allergies    Patient has no known allergies.    Review of Systems   Review of Systems  Physical Exam Updated Vital Signs BP (!) 161/106 Comment: Provider aware  Pulse 80   Temp 97.7 F (36.5 C) (Oral)   Resp 17   Ht 6' (1.829 m)   Wt 93 kg   SpO2 100%   BMI 27.80 kg/m  Physical Exam Vitals and nursing note  reviewed.  Constitutional:      General: He is not in acute distress.    Appearance: He is well-developed.  HENT:     Head: Normocephalic and atraumatic.     Comments: No Battle sign or raccoon eyes    Right Ear: Tympanic membrane, ear canal and external ear normal.     Left Ear: Tympanic membrane, ear canal and external ear normal.     Nose: Nose normal.  Eyes:     Extraocular Movements: Extraocular movements intact.     Conjunctiva/sclera: Conjunctivae normal.     Pupils: Pupils are equal, round, and reactive to light.     Comments: Pupils 5 mm bilaterally   Visual Acuity: L: 20/16   R: 20/25   Both eyes: 20/12.5  Neck:     Comments: Paraspinal tenderness to palpation on the right side.  No midline C-spine tenderness to palpation. Musculoskeletal:     Cervical back: Normal range of motion and neck supple.     Right lower leg: No edema.     Left lower leg: No edema.  Skin:    General: Skin is warm and dry.  Neurological:     Mental Status: He is alert.     Comments: MENTAL STATUS: AAOx3 CRANIAL NERVES: II: Pupils equal and reactive  5 mm BL, no RAPD, no VF deficits III, IV, VI: EOM intact, no gaze preference or deviation, no nystagmus. V: normal sensation to light touch in V1, V2, and V3 segments bilaterally VII: no facial weakness or asymmetry, no nasolabial fold flattening VIII: normal hearing to speech and finger friction IX, X: normal palatal elevation, no uvular deviation XI: 5/5 head turn and 5/5 shoulder shrug bilaterally XII: midline tongue protrusion MOTOR: 5/5 strength in R shoulder flexion, elbow flexion and extension, and grip strength. 5/5 strength in L shoulder flexion, elbow flexion and extension, and grip strength.  5/5 strength in R hip and knee flexion, knee extension, ankle plantar and dorsiflexion. 5/5 strength in L hip and knee flexion, knee extension, ankle plantar and dorsiflexion. SENSORY: Normal sensation to light touch in all extremities COORD:  Normal finger to nose and heel to shin, no tremor, no dysmetria STATION: normal stance, no truncal ataxia GAIT: Normal   Psychiatric:        Mood and Affect: Mood normal.        Behavior: Behavior normal.     ED Results / Procedures / Treatments   Labs (all labs ordered are listed, but only abnormal results are displayed) Labs Reviewed - No data to display  EKG None  Radiology No results found.  Procedures Procedures    Medications Ordered in ED Medications - No data to display  ED Course/ Medical Decision Making/ A&P                             Medical Decision Making  Daniel Smith is a 27 y.o. male with comorbidities that complicate the patient evaluation including hydrocephalus as a child who presents after head trauma  Initial Ddx:  TBI, C-spine injury, facial fracture, concussion, retinal detachment  MDM:  Given the patient's head trauma feel the likely had a concussion.  Considered TBI but patient is [place] head CT rule negative.  Did have a risk-benefit discussion send the patient does have a history of hydrocephalus about a CT at this point in time but given his nonfocal neuroexam and the fact that this happened on Tuesday night feel that TBI is highly unlikely and that he would be more symptomatic at this time she did have an intervenable bleed.  Patient opted not to have a head CT.  Currently is Nexus C-spine negative as well so do not feel that head or C-spine imaging is warranted.  Feel the patient likely had a concussion based on his symptoms.  Unclear was causing the flashes out of his right eye.  Could potentially have had a traumatic retinal detachment so we will reach out to ophthalmology at this time and figure out follow-up for the patient or if he needs a dilated eye exam now.  Plan:  Pain control Concussion precautions Ophthalmology follow-up  ED Summary/Re-evaluation:  Patient counseled on concussion precautions.  Was instructed to take  over-the-counter pain medication for any headaches that they would have and gradually return to work and school.  Counseled to not return to sports until cleared to do so by healthcare provider.  Will have him follow-up with their primary doctor or sports medicine clinic in 1 week from the date of the injury.  Did discuss the patient with the on-call ophthalmologist who recommends having him call for a dilated exam within the next few days.  This patient presents to the ED for concern of complaints listed in HPI,  this involves an extensive number of treatment options, and is a complaint that carries with it a high risk of complications and morbidity. Disposition including potential need for admission considered.   Dispo: DC Home. Return precautions discussed including, but not limited to, those listed in the AVS. Allowed pt time to ask questions which were answered fully prior to dc.  Records reviewed Outpatient Clinic Notes Ophthalmology consulted         Final Clinical Impression(s) / ED Diagnoses Final diagnoses:  Concussion with unknown loss of consciousness status, initial encounter  Elevated blood pressure reading  Vision changes    Rx / DC Orders ED Discharge Orders     None         Rondel Baton, MD 02/24/23 1228

## 2023-02-24 NOTE — Discharge Instructions (Addendum)
You were seen for your head injury in the emergency department. It is likely that you had a concussion.  At home, please use tylenol and ibuprofen for any headaches that you may have.   Return gradually to work and school over the next week. Take breaks if you are having headaches or trouble thinking clearly.   Follow-up with your primary doctor or concussion clinic in 1 week from the date of your injury regarding your visit.  DO NOT return to playing sports or activities where you could sustain additional head trauma until cleared to do so by a healthcare provider.   Please call Dr. Dione Booze today using the phone number listed in this packet to discuss an ophthalmology appointment for the flashes in your eye.  Return immediately to the emergency department if you experience any of the following: severe headache, persistent vomiting, or any other concerning symptoms.    Thank you for visiting our Emergency Department. It was a pleasure taking care of you today.

## 2023-02-24 NOTE — ED Triage Notes (Signed)
Pt arrives via POV. Pt reports he fell out of his bed Tuesday night and hit his head on a door frame. Pt reports since then, he has had intermittent headaches and a dazed and confused feeling. Pt is AxOx4. Denies blood thinners.

## 2023-02-24 NOTE — ED Notes (Signed)
Visual Acuity: L: 20/16   R: 20/25   Both eyes: 20/12.5

## 2023-02-27 ENCOUNTER — Telehealth: Payer: Self-pay | Admitting: Internal Medicine

## 2023-02-27 NOTE — Transitions of Care (Post Inpatient/ED Visit) (Unsigned)
   02/27/2023  Name: Daniel Smith MRN: [id number] DOB: 1996/01/26  Today's TOC FU Call Status: Today's TOC FU Call Status:: Unsuccessul Call (1st Attempt) Unsuccessful Call (1st Attempt) Date: 02/27/23  Attempted to reach the patient regarding the most recent Inpatient/ED visit.  Follow Up Plan: Additional outreach attempts will be made to reach the patient to complete the Transitions of Care (Post Inpatient/ED visit) call.   [employer]

## 2023-05-02 ENCOUNTER — Telehealth: Payer: BC Managed Care – PPO | Admitting: Physician Assistant

## 2023-05-02 DIAGNOSIS — B9689 Other specified bacterial agents as the cause of diseases classified elsewhere: Secondary | ICD-10-CM | POA: Diagnosis not present

## 2023-05-02 DIAGNOSIS — J208 Acute bronchitis due to other specified organisms: Secondary | ICD-10-CM | POA: Diagnosis not present

## 2023-05-02 MED ORDER — AZITHROMYCIN 250 MG PO TABS: ORAL_TABLET | ORAL | 0 refills | Status: AC

## 2023-05-02 MED ORDER — BENZONATATE 100 MG PO CAPS: 100.0000 mg | ORAL_CAPSULE | Freq: Three times a day (TID) | ORAL | 0 refills | Status: AC | PRN

## 2023-05-02 NOTE — Progress Notes (Signed)

## 2023-07-20 ENCOUNTER — Telehealth: Payer: BC Managed Care – PPO | Admitting: Physician Assistant

## 2023-07-20 DIAGNOSIS — H109 Unspecified conjunctivitis: Secondary | ICD-10-CM | POA: Diagnosis not present

## 2023-07-20 DIAGNOSIS — L03213 Periorbital cellulitis: Secondary | ICD-10-CM

## 2023-07-20 MED ORDER — AMOXICILLIN-POT CLAVULANATE 875-125 MG PO TABS: 1.0000 | ORAL_TABLET | Freq: Two times a day (BID) | ORAL | 0 refills | Status: AC

## 2023-07-20 MED ORDER — OFLOXACIN 0.3 % OP SOLN: 1.0000 [drp] | Freq: Four times a day (QID) | OPHTHALMIC | 0 refills | Status: AC

## 2023-07-20 NOTE — Patient Instructions (Signed)
Riley Lam, thank you for joining Margaretann Loveless, PA-C for today's virtual visit.  While this provider is not your primary care provider (PCP), if your PCP is located in our provider database this encounter information will be shared with them immediately following your visit.   A [facility] MyChart account gives you access to today's visit and all your visits, tests, and labs performed at [facility] " click here if you don't have a [facility] MyChart account or go to [email or web address]  Consent: (Patient) Daniel Smith provided verbal consent for this virtual visit at the beginning of the encounter.  Current Medications:  Current Outpatient Medications:    amoxicillin-clavulanate (AUGMENTIN) 875-125 MG tablet, Take 1 tablet by mouth 2 (two) times daily., Disp: 14 tablet, Rfl: 0   ofloxacin (OCUFLOX) 0.3 % ophthalmic solution, Place 1 drop into the left eye 4 (four) times daily. X 5 days, Disp: 5 mL, Rfl: 0   benzonatate (TESSALON) 100 MG capsule, Take 1 capsule (100 mg total) by mouth 3 (three) times daily as needed., Disp: 30 capsule, Rfl: 0   erythromycin ophthalmic ointment, Place 1 application into the left eye at bedtime. (Patient not taking: Reported on 05/17/2020), Disp: 3.5 g, Rfl: 0   hydrocortisone valerate ointment (WESTCORT) 0.2 %, Apply 1 application topically 2 (two) times daily. (Patient not taking: Reported on 06/30/2019), Disp: 45 g, Rfl: 0   valACYclovir (VALTREX) 1000 MG tablet, TAKE 2 TABLETS BY MOUTH TWICE A DAY FOR 1 DAY FOR FLARE UP, Disp: 20 tablet, Rfl: 0   Medications ordered in this encounter:  Meds ordered this encounter  Medications   amoxicillin-clavulanate (AUGMENTIN) 875-125 MG tablet    Sig: Take 1 tablet by mouth 2 (two) times daily.    Dispense:  14 tablet    Refill:  0    Order Specific Question:   Supervising Provider    Answer:   Merrilee Jansky [[id number]]   ofloxacin (OCUFLOX) 0.3 % ophthalmic solution    Sig: Place 1  drop into the left eye 4 (four) times daily. X 5 days    Dispense:  5 mL    Refill:  0    Order Specific Question:   Supervising Provider    Answer:   Merrilee Jansky [[id number]]     *If you need refills on other medications prior to your next appointment, please contact your pharmacy*  Follow-Up: Call back or seek an in-person evaluation if the symptoms worsen or if the condition fails to improve as anticipated.  [facility] [phone]  Other Instructions  Bacterial Conjunctivitis, Adult Bacterial conjunctivitis is an infection of the clear membrane that covers the white part of the eye and the inner surface of the eyelid (conjunctiva). When the blood vessels in the conjunctiva become inflamed, the eye becomes red or pink. The eye often feels irritated or itchy. Bacterial conjunctivitis spreads easily from person to person (is contagious). It also spreads easily from one eye to the other eye. What are the causes? This condition is caused by bacteria. You may get the infection if you come into close contact with: A person who is infected with the bacteria. Items that are contaminated with the bacteria, such as a face towel, contact lens solution, or eye makeup. What increases the risk? You are more likely to develop this condition if: You are exposed to other people who have the infection. You wear contact lenses. You have a sinus infection. You have had  a recent eye injury or surgery. You have a weak body defense system (immune system). You have a medical condition that causes dry eyes. What are the signs or symptoms? Symptoms of this condition include: Thick, yellowish discharge from the eye. This may turn into a crust on the eyelid overnight and cause your eyelids to stick together. Tearing or watery eyes. Itchy eyes. Burning feeling in your eyes. Eye redness. Swollen eyelids. Blurred vision. How is this diagnosed? This condition is diagnosed based on your  symptoms and medical history. Your health care provider may also take a sample of discharge from your eye to find the cause of your infection. How is this treated? This condition may be treated with: Antibiotic eye drops or ointment to clear the infection more quickly and prevent the spread of infection to others. Antibiotic medicines taken by mouth (orally) to treat infections that do not respond to drops or ointments or that last longer than 10 days. Cool, wet cloths (cool compresses) placed on the eyes. Artificial tears applied 2-6 times a day. Follow these instructions at home: Medicines Take or apply your antibiotic medicine as told by your health care provider. Do not stop using the antibiotic, even if your condition improves, unless directed by your health care provider. Take or apply over-the-counter and prescription medicines only as told by your health care provider. Be very careful to avoid touching the edge of your eyelid with the eye-drop bottle or the ointment tube when you apply medicines to the affected eye. This will keep you from spreading the infection to your other eye or to other people. Managing discomfort Gently wipe away any drainage from your eye with a warm, wet washcloth or a cotton ball. Apply a clean, cool compress to your eye for 10-20 minutes, 3-4 times a day. General instructions Do not wear contact lenses until the inflammation is gone and your health care provider says it is safe to wear them again. Ask your health care provider how to sterilize or replace your contact lenses before you use them again. Wear glasses until you can resume wearing contact lenses. Avoid wearing eye makeup until the inflammation is gone. Throw away any old eye cosmetics that may be contaminated. Change or wash your pillowcase every day. Do not share towels or washcloths. This may spread the infection. Wash your hands often with soap and water for at least 20 seconds and especially  before touching your face or eyes. Use paper towels to dry your hands. Avoid touching or rubbing your eyes. Do not drive or use heavy machinery if your vision is blurred. Contact a health care provider if: You have a fever. Your symptoms do not get better after 10 days. Get help right away if: You have a fever and your symptoms suddenly get worse. You have severe pain when you move your eye. You have facial pain, redness, or swelling. You have a sudden loss of vision. Summary Bacterial conjunctivitis is an infection of the clear membrane that covers the white part of the eye and the inner surface of the eyelid (conjunctiva). Bacterial conjunctivitis spreads easily from eye to eye and from person to person (is contagious). Wash your hands often with soap and water for at least 20 seconds and especially before touching your face or eyes. Use paper towels to dry your hands. Take or apply your antibiotic medicine as told by your health care provider. Do not stop using the antibiotic even if your condition improves. Contact a health care  provider if you have a fever or if your symptoms do not get better after 10 days. Get help right away if you have a sudden loss of vision. This information is not intended to replace advice given to you by your health care provider. Make sure you discuss any questions you have with your health care provider. Document Revised: 12/15/2020 Document Reviewed: 12/15/2020 Elsevier Patient Education  2024 Else[employer] Inc.   Preseptal Cellulitis, Adult Preseptal cellulitis is an infection of the eyelid and the tissues around the eye (periorbital area). The infection causes painful swelling and redness. This condition may also be called periorbital cellulitis. In most cases, the condition can be treated with antibiotic medicine at home. It is important to treat preseptal cellulitis right away so that it does not get worse. If it gets worse, it can spread to the eye socket  and eye muscles (orbital cellulitis). Orbital cellulitis is a medical emergency. What are the causes? Preseptal cellulitis is most commonly caused by bacteria. In rare cases, it can be caused by a virus or fungus. The germs that cause preseptal cellulitis may come from: A sinus infection that spreads near the eyes. An injury near the eye, such as a scratch, puncture wound, animal bite, or insect bite. A skin rash, such as eczema or poison ivy, that becomes infected. An infected pimple on the eyelid (stye). Infection after eyelid surgery or injury. What increases the risk? You are more likely to develop this condition if: You have a weakened disease-fighting system (immune system). You have a medical condition that raises your risk for sinus infections, such as nasal polyps. What are the signs or symptoms? Symptoms of this condition include: Eyelids that are red and swollen and feel unusually hot. Fever. Difficulty opening the eye. Headache. Pain in the face. Symptoms of this condition usually develop suddenly. How is this diagnosed? This condition may be diagnosed based on your symptoms, your medical history, and an eye exam. You may also have tests, such as: Blood tests. Tests (cultures) to find out which specific bacteria are causing the infection. You may have a culture of any open wound or drainage. CT scan. MRI. This is less common. How is this treated? This condition is treated with antibiotic medicines. These may be given by mouth (orally), through an IV, or as an injection. In rare cases, you may need surgery to drain an infected area. Follow these instructions at home: Medicines Take your antibiotic medicine as told by your health care provider. Do not stop taking the antibiotic even if you start to feel better Take over-the-counter and prescription medicines only as told by your health care provider. Eye Care Do not use eye drops without first getting approval from your  health care provider. Do not touch or rub your eye. If you wear contact lenses, do not wear them until your health care provider approves. Keep the eye area clean and dry. Wash the eye area with a clean washcloth, warm water, and baby shampoo or mild soap. To help relieve discomfort, place a clean washcloth that is wet with warm water over your eye. Leave the washcloth on for a few minutes, then remove it. General instructions Wash your hands with soap and water often for at least 20 seconds. If soap and water are not available, use hand sanitizer. Do not use any products that contain nicotine or tobacco, such as cigarettes, e-cigarettes, and chewing tobacco. If you need help quitting, ask your health care provider. Drink enough fluid  to keep your urine pale yellow. Do not drive or operate machinery until your health care provider says that it is safe. Ask your health care provider if it is safe for you to drive. Stay up to date on your vaccinations. Keep all follow-up visits. This includes any visits with an eye specialist (ophthalmologist) or dentist. This is important. Get help right away if: You have new symptoms. Your symptoms get worse or do not get better with treatment. You have a fever. Your vision becomes blurry or gets worse in any way. Your eye looks like it is sticking out or bulging out (proptosis). You develop double vision. You have trouble moving your eyes or pain when moving your eyes You have a severe headache. You have neck stiffness or severe neck pain. These symptoms may represent a serious problem that is an emergency. Do not wait to see if the symptoms will go away. Get medical help right away. Call your local emergency services (911 in the [place] Do not drive yourself to the hospital. Summary Preseptal cellulitis is an infection of the eyelid and the tissues around the eye. Symptoms of preseptal cellulitis usually develop suddenly and include red and swollen eyelids,  fever, difficulty opening the eye, headache, and facial pain. This condition is treated with antibiotic medicines. Do not stop taking the antibiotic even if you start to feel better. Preseptal cellulitis can develop into orbital cellulitis, which is a medical emergency. If your condition does not improve or worsens, visit your heath care provider right away. This information is not intended to replace advice given to you by your health care provider. Make sure you discuss any questions you have with your health care provider. Document Revised: 01/06/2020 Document Reviewed: 01/07/2020 Elsevier Patient Education  2024 Elsevier Inc.    If you have been instructed to have an in-person evaluation today at a local Urgent Care facility, please use the link below. It will take you to a list of all of our available [facility], including address, phone number and hours of operation. Please do not delay care.  [facility]  If you or a family member do not have a primary care provider, use the link below to schedule a visit and establish care. When you choose a [facility] primary care physician or advanced practice provider, you gain a long-term partner in health. Find a Primary Care Provider  Learn more about [facility] in-office and virtual care options: [facility] - Get Care Now

## 2023-07-20 NOTE — Progress Notes (Signed)
Virtual Visit Consent   Daniel Smith, you are scheduled for a virtual visit with a [facility] provider today. Just as with appointments in the office, your consent must be obtained to participate. Your consent will be active for this visit and any virtual visit you may have with one of our providers in the next 365 days. If you have a MyChart account, a copy of this consent can be sent to you electronically.  As this is a virtual visit, video technology does not allow for your provider to perform a traditional examination. This may limit your provider's ability to fully assess your condition. If your provider identifies any concerns that need to be evaluated in person or the need to arrange testing (such as labs, EKG, etc.), we will make arrangements to do so. Although advances in technology are sophisticated, we cannot ensure that it will always work on either your end or our end. If the connection with a video visit is poor, the visit may have to be switched to a telephone visit. With either a video or telephone visit, we are not always able to ensure that we have a secure connection.  By engaging in this virtual visit, you consent to the provision of healthcare and authorize for your insurance to be billed (if applicable) for the services provided during this visit. Depending on your insurance coverage, you may receive a charge related to this service.  I need to obtain your verbal consent now. Are you willing to proceed with your visit today? Daniel Smith has provided verbal consent on 07/20/2023 for a virtual visit (video or telephone). Margaretann Loveless, PA-C  Date: 07/20/2023 5:49 PM  Virtual Visit via Video Note   I, Margaretann Loveless, connected with  Daniel Smith  ([id number], 1996-03-08) on 07/20/23 at  5:45 PM EDT by a video-enabled telemedicine application and verified that I am speaking with the correct person using two identifiers.  Location: Patient: Virtual Visit Location Patient:  Home Provider: Virtual Visit Location Provider: Home Office   I discussed the limitations of evaluation and management by telemedicine and the availability of in person appointments. The patient expressed understanding and agreed to proceed.    History of Present Illness: Daniel Smith is a 27 y.o. who identifies as a male who was assigned male at birth, and is being seen today for eye redness and irritation.  HPI: Eye Problem  The left eye is affected. This is a new problem. The current episode started in the past 7 days (2 days). The problem occurs constantly. The problem has been gradually worsening. There was no injury mechanism. The pain is mild. There is No known exposure to pink eye. He Does not wear contacts. Associated symptoms include blurred vision, an eye discharge (purulent started today), eye redness and a foreign body sensation. Pertinent negatives include no double vision, fever, itching, nausea, photophobia, recent URI or vomiting. He has tried eye drops for the symptoms. The treatment provided no relief.   Started with feelings of a stye in the left lower eyelid medial corner. Then progressed to include redness of the left eye and feelings of irritation/foreign body sensation.  Problems:  Patient Active Problem List   Diagnosis Date Noted   Encounter for health maintenance examination in adult 05/17/2020   Screening for lipid disorders 05/17/2020   Vaccine counseling 05/17/2020   Vesicular lesion 07/02/2019   COVID-19 virus infection 07/02/2019   Rash 07/02/2019   Dry skin dermatitis 11/18/2018  Obsession 11/18/2018   Anxiety 11/18/2018   Cold sore 11/18/2018    Allergies: No Known Allergies Medications:  Current Outpatient Medications:    amoxicillin-clavulanate (AUGMENTIN) 875-125 MG tablet, Take 1 tablet by mouth 2 (two) times daily., Disp: 14 tablet, Rfl: 0   ofloxacin (OCUFLOX) 0.3 % ophthalmic solution, Place 1 drop into the left eye 4 (four) times daily. X 5  days, Disp: 5 mL, Rfl: 0   benzonatate (TESSALON) 100 MG capsule, Take 1 capsule (100 mg total) by mouth 3 (three) times daily as needed., Disp: 30 capsule, Rfl: 0   erythromycin ophthalmic ointment, Place 1 application into the left eye at bedtime. (Patient not taking: Reported on 05/17/2020), Disp: 3.5 g, Rfl: 0   hydrocortisone valerate ointment (WESTCORT) 0.2 %, Apply 1 application topically 2 (two) times daily. (Patient not taking: Reported on 06/30/2019), Disp: 45 g, Rfl: 0   valACYclovir (VALTREX) 1000 MG tablet, TAKE 2 TABLETS BY MOUTH TWICE A DAY FOR 1 DAY FOR FLARE UP, Disp: 20 tablet, Rfl: 0  Observations/Objective: Patient is well-developed, well-nourished in no acute distress.  Resting comfortably at home.  Head is normocephalic, atraumatic.  No labored breathing.  Speech is clear and coherent with logical content.  Patient is alert and oriented at baseline.  Injected left eye with swelling and redness noted in the left lower eyelid  Assessment and Plan: 1. Bacterial conjunctivitis of left eye - amoxicillin-clavulanate (AUGMENTIN) 875-125 MG tablet; Take 1 tablet by mouth 2 (two) times daily.  Dispense: 14 tablet; Refill: 0 - ofloxacin (OCUFLOX) 0.3 % ophthalmic solution; Place 1 drop into the left eye 4 (four) times daily. X 5 days  Dispense: 5 mL; Refill: 0  2. Preseptal cellulitis of left lower eyelid - amoxicillin-clavulanate (AUGMENTIN) 875-125 MG tablet; Take 1 tablet by mouth 2 (two) times daily.  Dispense: 14 tablet; Refill: 0  - Suspect bacterial conjunctivitis - Augmentin and Ofloxacin prescribed - Warm compresses - Good hand hygiene - Seek in person evaluation if symptoms worsen or fail to improve   Follow Up Instructions: I discussed the assessment and treatment plan with the patient. The patient was provided an opportunity to ask questions and all were answered. The patient agreed with the plan and demonstrated an understanding of the instructions.  A copy of  instructions were sent to the patient via MyChart unless otherwise noted below.    The patient was advised to call back or seek an in-person evaluation if the symptoms worsen or if the condition fails to improve as anticipated.    Margaretann Loveless, PA-C

## 2023-07-25 ENCOUNTER — Encounter: Payer: Self-pay | Admitting: Family Medicine

## 2023-07-25 ENCOUNTER — Ambulatory Visit: Payer: BC Managed Care – PPO | Admitting: Family Medicine

## 2023-07-25 VITALS — BP 128/82 | HR 70 | Temp 97.9°F | Wt 219.0 lb

## 2023-07-25 DIAGNOSIS — H00015 Hordeolum externum left lower eyelid: Secondary | ICD-10-CM | POA: Diagnosis not present

## 2023-07-25 NOTE — Progress Notes (Signed)
   Subjective:    Patient ID: Daniel Smith, male    DOB: [birth year], 27 y.o.   MRN: [id number]  HPI He developed a pinkeye over the weekend but also noted some swelling and discomfort to the medial aspect of the lower eyelid on the left.  He did a virtual visit and he gave an antibiotic as well as drops.  He states that the pain went away at the same time that he noted increase in drainage.  Today it is doing better but does have some slight itching.   Review of Systems     Objective:    Physical Exam Exam of the left eye does show healing stye to the medial lower eyelid.  Cornea conjunctive normal.       Assessment & Plan:  Hordeolum externum of left lower eyelid Recommend warm soak 3 times per day and continue to use the eyedrops for the next several days until his quiets down even further.  He was comfortable with that.

## 2024-01-16 ENCOUNTER — Other Ambulatory Visit: Payer: Self-pay | Admitting: Family Medicine

## 2024-01-16 ENCOUNTER — Ambulatory Visit: Payer: Self-pay

## 2024-01-16 DIAGNOSIS — M545 Low back pain, unspecified: Secondary | ICD-10-CM
